# Patient Record
Sex: Female | Born: 1935 | Race: White | Hispanic: No | Marital: Married
Health system: Southern US, Community
[De-identification: ages and names within clinical notes are randomized; demographics above are authoritative.]

---

## 2004-08-17 ENCOUNTER — Ambulatory Visit: Payer: Self-pay | Admitting: Internal Medicine

## 2004-11-04 ENCOUNTER — Ambulatory Visit: Payer: Self-pay | Admitting: Internal Medicine

## 2005-02-03 ENCOUNTER — Ambulatory Visit: Payer: Self-pay | Admitting: Internal Medicine

## 2005-05-12 ENCOUNTER — Ambulatory Visit: Payer: Self-pay | Admitting: Internal Medicine

## 2005-08-04 ENCOUNTER — Ambulatory Visit: Payer: Self-pay | Admitting: Internal Medicine

## 2005-08-17 ENCOUNTER — Ambulatory Visit: Payer: Self-pay | Admitting: Internal Medicine

## 2005-11-06 ENCOUNTER — Ambulatory Visit: Payer: Self-pay | Admitting: Internal Medicine

## 2005-11-17 ENCOUNTER — Ambulatory Visit: Payer: Self-pay | Admitting: Internal Medicine

## 2006-03-06 ENCOUNTER — Ambulatory Visit: Payer: Self-pay | Admitting: Internal Medicine

## 2006-09-03 ENCOUNTER — Ambulatory Visit: Payer: Self-pay | Admitting: Internal Medicine

## 2006-09-17 ENCOUNTER — Ambulatory Visit: Payer: Self-pay | Admitting: Internal Medicine

## 2007-02-16 ENCOUNTER — Ambulatory Visit: Payer: Self-pay | Admitting: Internal Medicine

## 2007-03-02 ENCOUNTER — Ambulatory Visit: Payer: Self-pay | Admitting: Internal Medicine

## 2007-03-18 ENCOUNTER — Ambulatory Visit: Payer: Self-pay | Admitting: Internal Medicine

## 2007-06-14 ENCOUNTER — Ambulatory Visit: Payer: Self-pay | Admitting: Ophthalmology

## 2007-06-14 ENCOUNTER — Other Ambulatory Visit: Payer: Self-pay

## 2007-06-21 ENCOUNTER — Ambulatory Visit: Payer: Self-pay | Admitting: Ophthalmology

## 2007-08-18 ENCOUNTER — Ambulatory Visit: Payer: Self-pay | Admitting: Internal Medicine

## 2007-08-31 ENCOUNTER — Ambulatory Visit: Payer: Self-pay | Admitting: Internal Medicine

## 2007-09-17 ENCOUNTER — Ambulatory Visit: Payer: Self-pay

## 2007-09-18 ENCOUNTER — Ambulatory Visit: Payer: Self-pay | Admitting: Internal Medicine

## 2007-10-04 ENCOUNTER — Ambulatory Visit: Payer: Self-pay | Admitting: Urology

## 2007-12-01 ENCOUNTER — Ambulatory Visit: Payer: Self-pay | Admitting: Urology

## 2008-03-13 ENCOUNTER — Ambulatory Visit: Payer: Self-pay | Admitting: Urology

## 2008-06-16 ENCOUNTER — Ambulatory Visit: Payer: Self-pay | Admitting: Urology

## 2008-06-17 ENCOUNTER — Ambulatory Visit: Payer: Self-pay | Admitting: Internal Medicine

## 2008-06-30 ENCOUNTER — Ambulatory Visit: Payer: Self-pay | Admitting: Internal Medicine

## 2008-07-18 ENCOUNTER — Ambulatory Visit: Payer: Self-pay | Admitting: Internal Medicine

## 2008-08-17 ENCOUNTER — Ambulatory Visit: Payer: Self-pay | Admitting: Internal Medicine

## 2008-09-17 ENCOUNTER — Ambulatory Visit: Payer: Self-pay | Admitting: Internal Medicine

## 2008-09-17 ENCOUNTER — Ambulatory Visit: Payer: Self-pay | Admitting: Radiation Oncology

## 2008-10-17 ENCOUNTER — Ambulatory Visit: Payer: Self-pay | Admitting: Radiation Oncology

## 2008-10-17 ENCOUNTER — Ambulatory Visit: Payer: Self-pay | Admitting: Internal Medicine

## 2008-10-30 ENCOUNTER — Inpatient Hospital Stay: Payer: Self-pay | Admitting: Internal Medicine

## 2008-11-06 ENCOUNTER — Inpatient Hospital Stay: Payer: Self-pay | Admitting: Oncology

## 2008-11-17 ENCOUNTER — Ambulatory Visit: Payer: Self-pay | Admitting: Radiation Oncology

## 2008-11-25 ENCOUNTER — Ambulatory Visit: Payer: Self-pay | Admitting: Internal Medicine

## 2008-12-18 ENCOUNTER — Ambulatory Visit: Payer: Self-pay | Admitting: Radiation Oncology

## 2008-12-18 ENCOUNTER — Ambulatory Visit: Payer: Self-pay | Admitting: Internal Medicine

## 2009-01-15 ENCOUNTER — Ambulatory Visit: Payer: Self-pay | Admitting: Radiation Oncology

## 2009-01-15 ENCOUNTER — Ambulatory Visit: Payer: Self-pay | Admitting: Internal Medicine

## 2009-02-15 ENCOUNTER — Ambulatory Visit: Payer: Self-pay | Admitting: Radiation Oncology

## 2009-02-15 ENCOUNTER — Ambulatory Visit: Payer: Self-pay | Admitting: Internal Medicine

## 2009-03-17 ENCOUNTER — Ambulatory Visit: Payer: Self-pay | Admitting: Oncology

## 2009-03-21 ENCOUNTER — Ambulatory Visit: Payer: Self-pay | Admitting: Oncology

## 2009-04-17 ENCOUNTER — Ambulatory Visit: Payer: Self-pay | Admitting: Oncology

## 2009-05-17 ENCOUNTER — Ambulatory Visit: Payer: Self-pay | Admitting: Oncology

## 2009-06-17 ENCOUNTER — Ambulatory Visit: Payer: Self-pay | Admitting: Oncology

## 2009-06-21 ENCOUNTER — Ambulatory Visit: Payer: Self-pay | Admitting: Oncology

## 2009-07-18 ENCOUNTER — Ambulatory Visit: Payer: Self-pay | Admitting: Oncology

## 2009-08-05 ENCOUNTER — Emergency Department: Payer: Self-pay | Admitting: Emergency Medicine

## 2009-08-05 IMAGING — CT CT ABDOMEN WO/W CM
2 of 4 series · 14 of 32 positions shown, 19 images · non-contrast
Comparison: none

REASON FOR EXAM: Proteinuria, renal cell CA
COMMENTS:

PROCEDURE:     CT  - CT ABDOMEN STANDARD W/WO  - December 01, 2007  [DATE]
RESULT:     Triphasic CT of the abdomen is performed utilizing 85 ml of
Qsovue-JLX iodinated intravenous contrast.
Comparison is made to the most recent study dated 09/17/2007.

[Series 4: soft tissue with · axial · 0.63mm/px · z∈[-830,-654]mm · 8 of 47 slices shown, 13 images]
[im 6/47  soft-tissue]
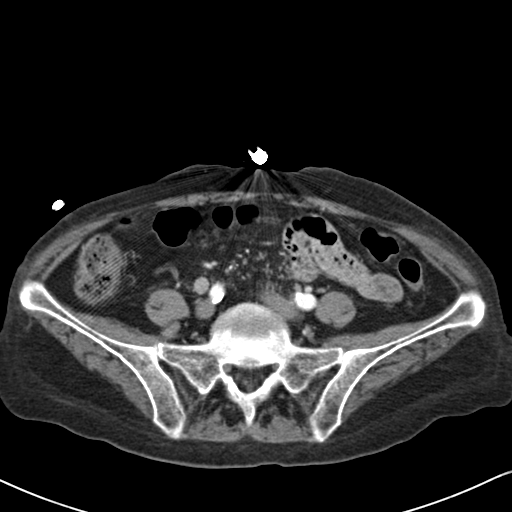
[im 6/47  bone]
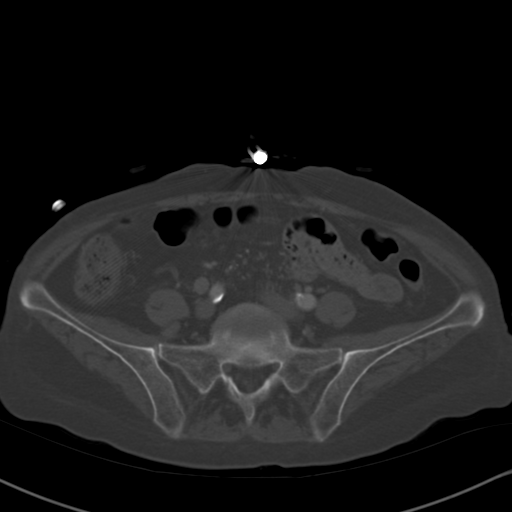
[im 11/47  soft-tissue]
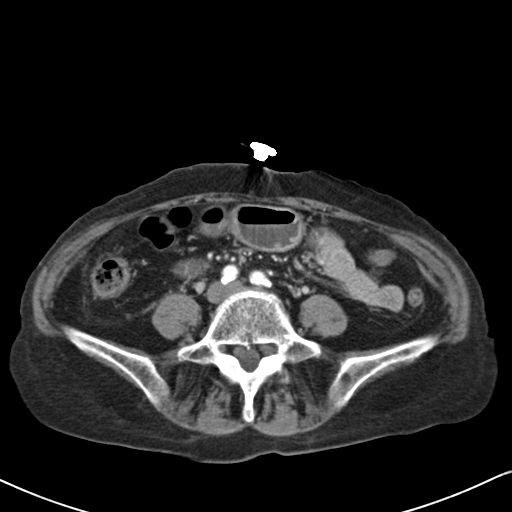
[im 16/47  soft-tissue]
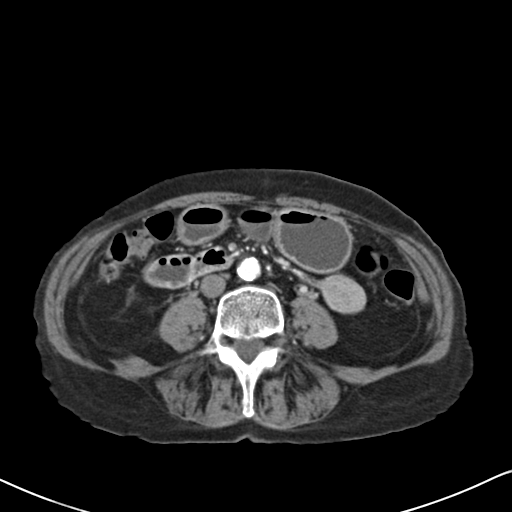
[im 21/47  soft-tissue]
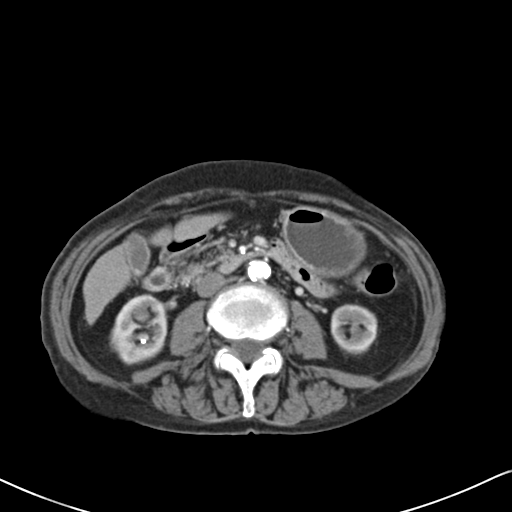
[im 26/47  soft-tissue]
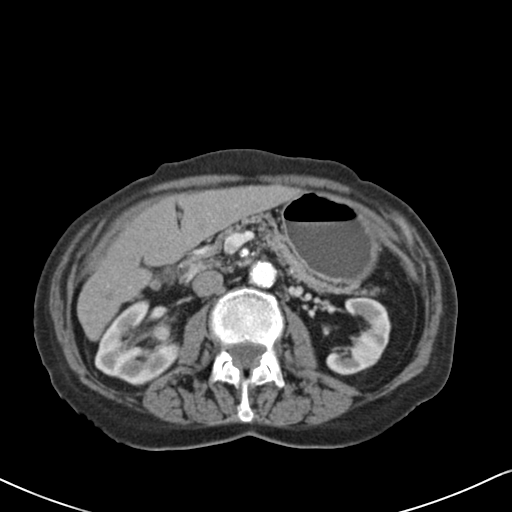
[im 26/47  lung]
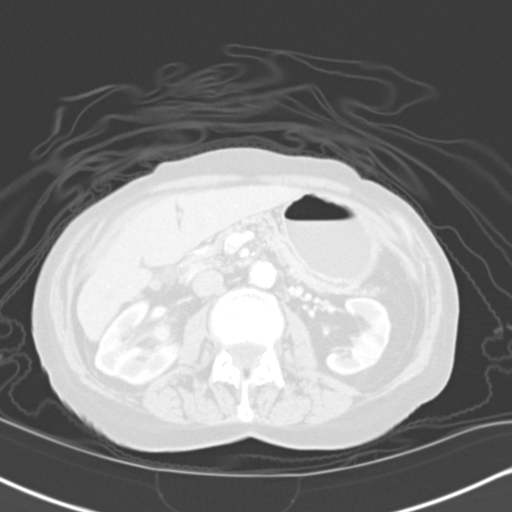
[im 31/47  soft-tissue]
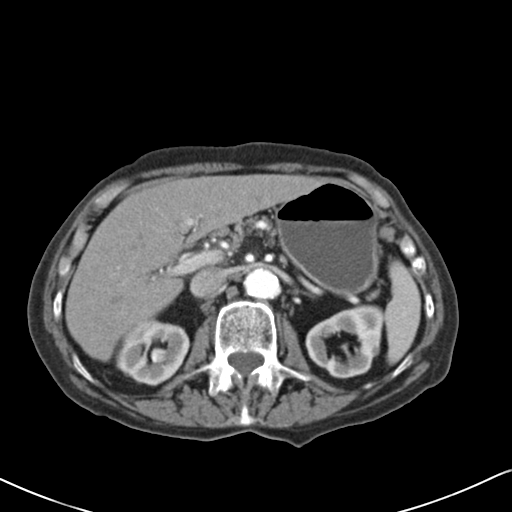
[im 31/47  lung]
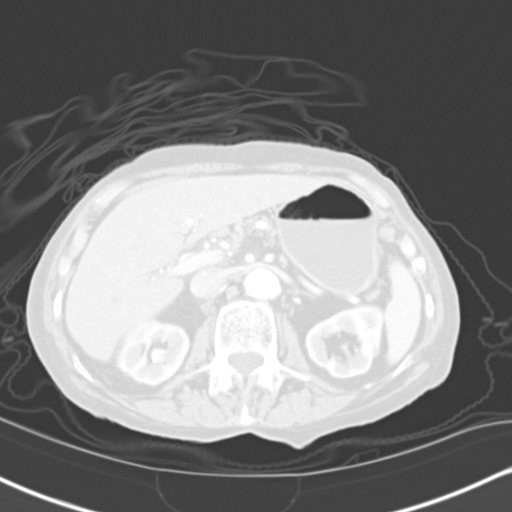
[im 36/47  soft-tissue]
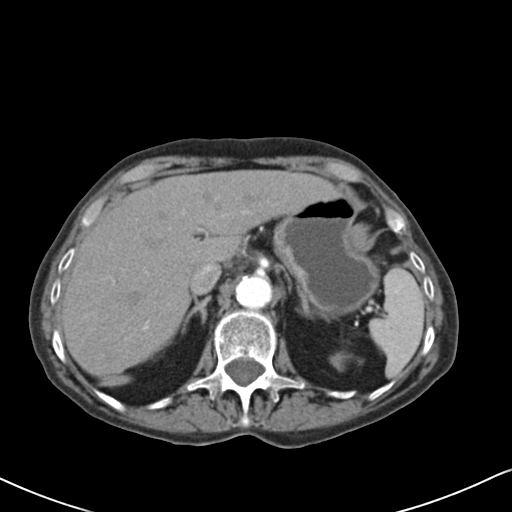
[im 36/47  lung]
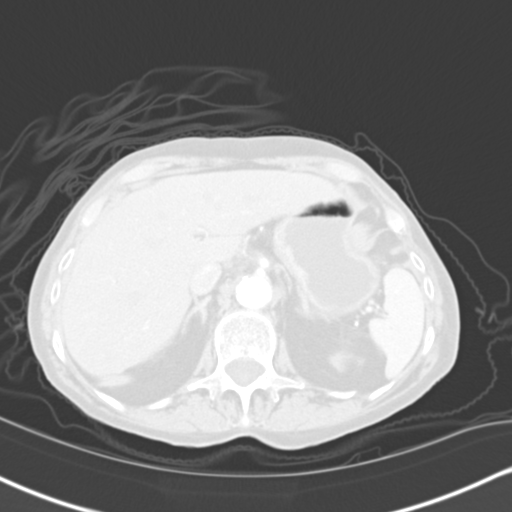
[im 41/47  soft-tissue]
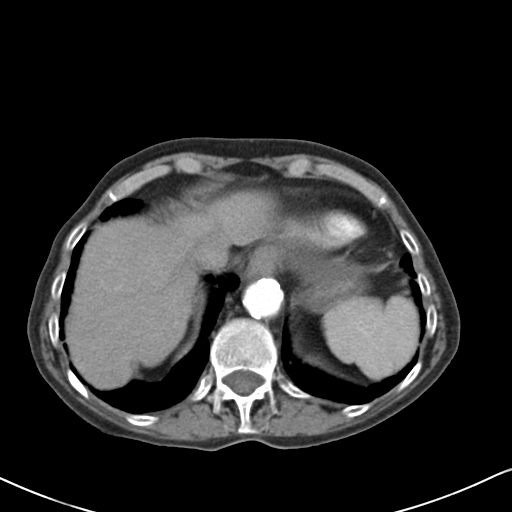
[im 41/47  lung]
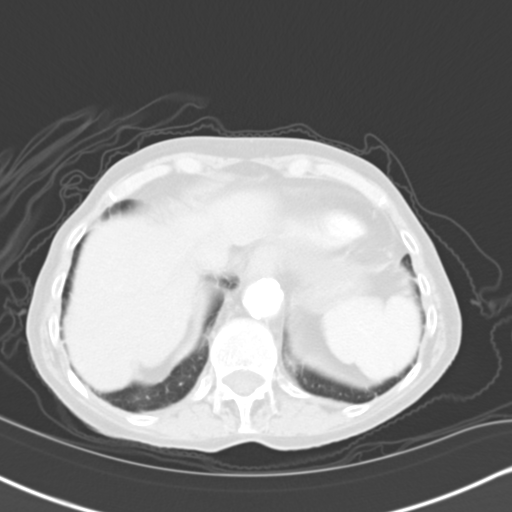

[Series 6: soft tissue delay · axial · delayed · 0.63mm/px · z∈[-830,-704]mm · 6 of 47 slices shown]
[im 6/47  soft-tissue]
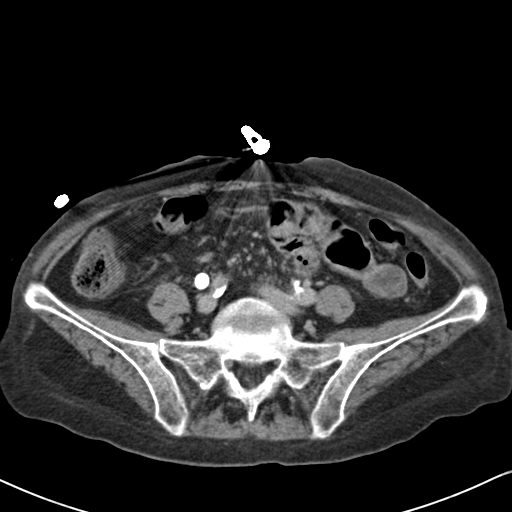
[im 11/47  soft-tissue]
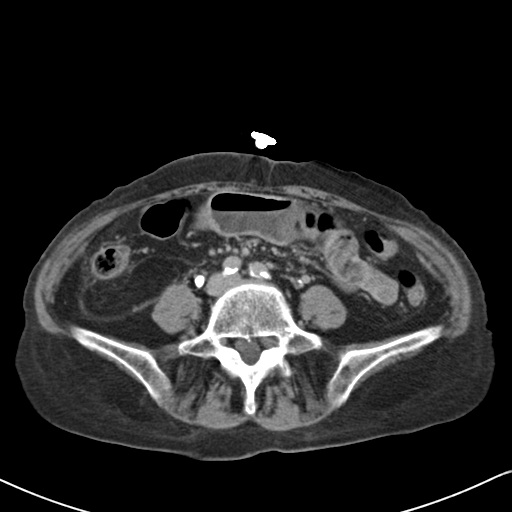
[im 16/47  soft-tissue]
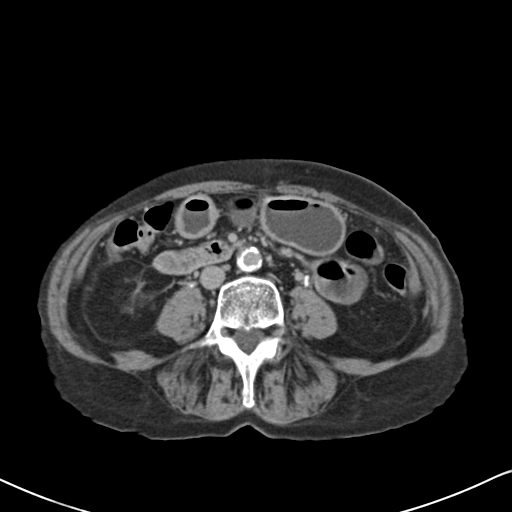
[im 21/47  soft-tissue]
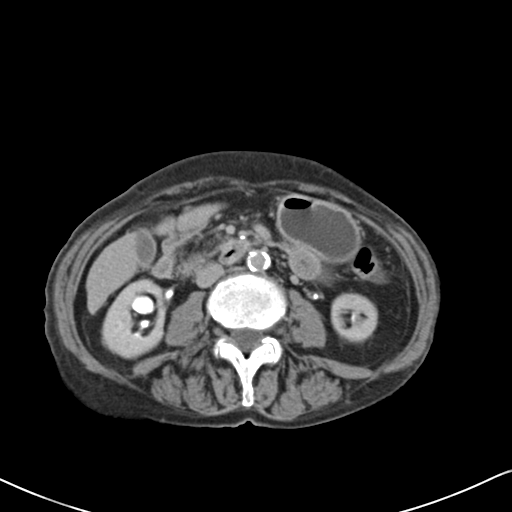
[im 26/47  soft-tissue]
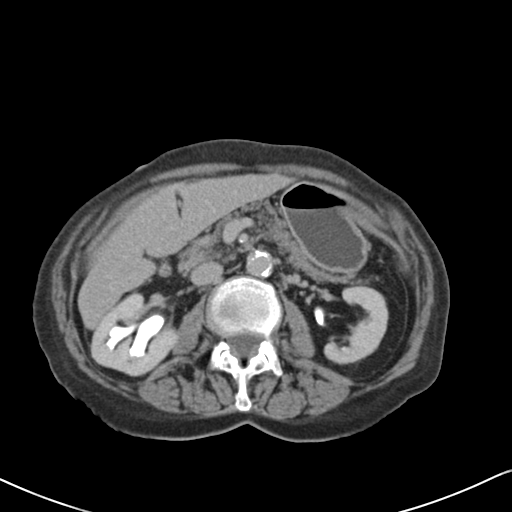
[im 31/47  soft-tissue]
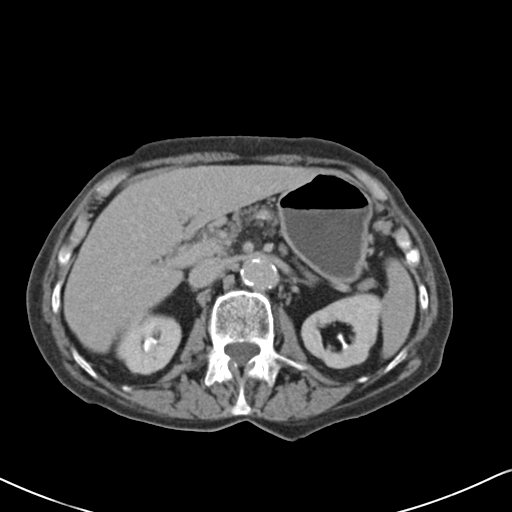

[14 of 32 positions shown; findings below may reference images not displayed]

FINDINGS: Today's exam shows no definite filling defect seen in the RIGHT
renal pelvis region. There is mild hydronephrosis on the RIGHT with
hydroureter evident. The distal portion of the ureter is not seen. Further
investigation may be necessary. There does not appear to be delayed
enhancement or excretion of contrast opacified urine to suggest a high-grade
obstruction. There is evidence of a lower pole RIGHT renal calculus, seen on
the noncontrast images on image #26. This area measures approximately 3.4 mm
in length. Atherosclerotic calcification is present. There is no aneurysm.
There is no abscess or free fluid. The abdominal viscera otherwise appear to
be grossly normal. There are multiple, low attenuation areas in the LEFT
kidney as noted previously. These are suggestive of cysts and appear stable.

Lung base images demonstrate linear fibrosis or atelectasis. There is no
nodule or mass. There is no effusion.
IMPRESSION: 1.  Mild, RIGHT hydronephrosis and proximal hydroureter.
2.  No filling defect evident in the RIGHT renal collecting system.
3.  Stable, low attenuation areas in the LEFT kidney suggestive of cysts.

## 2009-08-17 ENCOUNTER — Ambulatory Visit: Payer: Self-pay | Admitting: Oncology

## 2009-08-22 ENCOUNTER — Ambulatory Visit: Payer: Self-pay | Admitting: Oncology

## 2009-09-17 ENCOUNTER — Ambulatory Visit: Payer: Self-pay | Admitting: Oncology

## 2009-10-03 ENCOUNTER — Ambulatory Visit: Payer: Self-pay | Admitting: Oncology

## 2009-10-05 ENCOUNTER — Ambulatory Visit: Payer: Self-pay | Admitting: Oncology

## 2009-10-17 ENCOUNTER — Ambulatory Visit: Payer: Self-pay | Admitting: Oncology

## 2009-12-18 ENCOUNTER — Ambulatory Visit: Payer: Self-pay | Admitting: Oncology

## 2010-01-07 ENCOUNTER — Ambulatory Visit: Payer: Self-pay | Admitting: Oncology

## 2010-01-15 ENCOUNTER — Ambulatory Visit: Payer: Self-pay | Admitting: Oncology

## 2010-04-17 ENCOUNTER — Ambulatory Visit: Payer: Self-pay | Admitting: Oncology

## 2010-05-08 ENCOUNTER — Ambulatory Visit: Payer: Self-pay | Admitting: Oncology

## 2010-05-17 ENCOUNTER — Ambulatory Visit: Payer: Self-pay | Admitting: Oncology

## 2010-07-18 ENCOUNTER — Ambulatory Visit: Payer: Self-pay | Admitting: Oncology

## 2010-08-13 ENCOUNTER — Ambulatory Visit: Payer: Self-pay | Admitting: Physical Medicine and Rehabilitation

## 2010-08-17 ENCOUNTER — Ambulatory Visit: Payer: Self-pay | Admitting: Physical Medicine and Rehabilitation

## 2010-08-17 ENCOUNTER — Ambulatory Visit: Payer: Self-pay | Admitting: Oncology

## 2011-01-21 ENCOUNTER — Ambulatory Visit: Payer: Self-pay | Admitting: Oncology

## 2011-02-16 ENCOUNTER — Ambulatory Visit: Payer: Self-pay | Admitting: Oncology

## 2011-03-26 ENCOUNTER — Ambulatory Visit: Payer: Self-pay | Admitting: Ophthalmology

## 2011-06-06 ENCOUNTER — Ambulatory Visit: Payer: Self-pay | Admitting: Oncology

## 2011-06-18 ENCOUNTER — Ambulatory Visit: Payer: Self-pay | Admitting: Oncology

## 2011-10-07 ENCOUNTER — Ambulatory Visit: Payer: Self-pay | Admitting: Oncology

## 2011-10-18 ENCOUNTER — Ambulatory Visit: Payer: Self-pay | Admitting: Oncology

## 2011-12-30 ENCOUNTER — Ambulatory Visit: Payer: Self-pay | Admitting: Oncology

## 2011-12-30 LAB — COMPREHENSIVE METABOLIC PANEL
Anion Gap: 9 (ref 7–16)
BUN: 15 mg/dL (ref 7–18)
Creatinine: 1.63 mg/dL — ABNORMAL HIGH (ref 0.60–1.30)
EGFR (African American): 38 — ABNORMAL LOW
EGFR (Non-African Amer.): 31 — ABNORMAL LOW
Glucose: 85 mg/dL (ref 65–99)
Potassium: 4.4 mmol/L (ref 3.5–5.1)
SGPT (ALT): 21 U/L
Sodium: 134 mmol/L — ABNORMAL LOW (ref 136–145)
Total Protein: 7.8 g/dL (ref 6.4–8.2)

## 2011-12-30 LAB — CBC CANCER CENTER
Basophil %: 1 %
Eosinophil #: 0.2 x10 3/mm (ref 0.0–0.7)
Eosinophil %: 2.4 %
HGB: 11.6 g/dL — ABNORMAL LOW (ref 12.0–16.0)
Lymphocyte #: 1.1 x10 3/mm (ref 1.0–3.6)
Lymphocyte %: 15.8 %
MCH: 32.1 pg (ref 26.0–34.0)
MCHC: 33.7 g/dL (ref 32.0–36.0)
MCV: 95 fL (ref 80–100)
Monocyte %: 10.1 %
Neutrophil %: 70.7 %
RBC: 3.6 10*6/uL — ABNORMAL LOW (ref 3.80–5.20)
RDW: 14.1 % (ref 11.5–14.5)
WBC: 7 x10 3/mm (ref 3.6–11.0)

## 2012-01-16 ENCOUNTER — Ambulatory Visit: Payer: Self-pay | Admitting: Oncology

## 2012-03-30 ENCOUNTER — Ambulatory Visit: Payer: Self-pay | Admitting: Oncology

## 2012-03-30 LAB — CBC CANCER CENTER
Eosinophil #: 0 x10 3/mm (ref 0.0–0.7)
HCT: 34.5 % — ABNORMAL LOW (ref 35.0–47.0)
HGB: 11.1 g/dL — ABNORMAL LOW (ref 12.0–16.0)
Lymphocyte #: 1.2 x10 3/mm (ref 1.0–3.6)
Monocyte #: 1.1 x10 3/mm — ABNORMAL HIGH (ref 0.2–0.9)
Monocyte %: 11.9 %
Neutrophil #: 6.7 x10 3/mm — ABNORMAL HIGH (ref 1.4–6.5)
Neutrophil %: 74.4 %
Platelet: 133 x10 3/mm — ABNORMAL LOW (ref 150–440)
RDW: 15.8 % — ABNORMAL HIGH (ref 11.5–14.5)
WBC: 9 x10 3/mm (ref 3.6–11.0)

## 2012-03-30 LAB — COMPREHENSIVE METABOLIC PANEL
Albumin: 3.1 g/dL — ABNORMAL LOW (ref 3.4–5.0)
Alkaline Phosphatase: 56 U/L (ref 50–136)
Anion Gap: 11 (ref 7–16)
Chloride: 111 mmol/L — ABNORMAL HIGH (ref 98–107)
Co2: 20 mmol/L — ABNORMAL LOW (ref 21–32)
Creatinine: 2.29 mg/dL — ABNORMAL HIGH (ref 0.60–1.30)
EGFR (African American): 21 — ABNORMAL LOW
EGFR (Non-African Amer.): 18 — ABNORMAL LOW
Glucose: 122 mg/dL — ABNORMAL HIGH (ref 65–99)
Osmolality: 292 (ref 275–301)
SGOT(AST): 29 U/L (ref 15–37)
SGPT (ALT): 14 U/L
Sodium: 142 mmol/L (ref 136–145)

## 2012-04-17 ENCOUNTER — Ambulatory Visit: Payer: Self-pay | Admitting: Oncology

## 2012-06-17 ENCOUNTER — Ambulatory Visit: Payer: Self-pay | Admitting: Oncology

## 2012-07-13 LAB — URINALYSIS, COMPLETE
Bacteria: NONE SEEN
Bilirubin,UR: NEGATIVE
Blood: NEGATIVE
Glucose,UR: NEGATIVE mg/dL (ref 0–75)
Hyaline Cast: 1
Ketone: NEGATIVE
Leukocyte Esterase: NEGATIVE
Nitrite: NEGATIVE
Ph: 5 (ref 4.5–8.0)
Protein: NEGATIVE
RBC,UR: 1 /HPF (ref 0–5)
Specific Gravity: 1.006 (ref 1.003–1.030)
Squamous Epithelial: NONE SEEN
WBC UR: <1 /HPF (ref 0–5)

## 2012-07-13 LAB — COMPREHENSIVE METABOLIC PANEL
Albumin: 3.1 g/dL — ABNORMAL LOW (ref 3.4–5.0)
Alkaline Phosphatase: 57 U/L (ref 50–136)
Anion Gap: 9 (ref 7–16)
BUN: 36 mg/dL — ABNORMAL HIGH (ref 7–18)
Bilirubin,Total: 1.7 mg/dL — ABNORMAL HIGH (ref 0.2–1.0)
Calcium, Total: 8.8 mg/dL (ref 8.5–10.1)
Chloride: 104 mmol/L (ref 98–107)
Co2: 22 mmol/L (ref 21–32)
Creatinine: 1.96 mg/dL — ABNORMAL HIGH (ref 0.60–1.30)
EGFR (African American): 25 — ABNORMAL LOW
EGFR (Non-African Amer.): 22 — ABNORMAL LOW
Glucose: 99 mg/dL (ref 65–99)
Osmolality: 278 (ref 275–301)
Potassium: 4.5 mmol/L (ref 3.5–5.1)
SGOT(AST): 47 U/L — ABNORMAL HIGH (ref 15–37)
SGPT (ALT): 17 U/L (ref 12–78)
Sodium: 135 mmol/L — ABNORMAL LOW (ref 136–145)
Total Protein: 7 g/dL (ref 6.4–8.2)

## 2012-07-13 LAB — CBC WITH DIFFERENTIAL/PLATELET
Basophil #: 0.1 10*3/uL (ref 0.0–0.1)
Basophil %: 0.7 %
Eosinophil #: 0.1 10*3/uL (ref 0.0–0.7)
Eosinophil %: 0.6 %
HCT: 37.6 % (ref 35.0–47.0)
HGB: 12.3 g/dL (ref 12.0–16.0)
Lymphocyte #: 0.8 10*3/uL — ABNORMAL LOW (ref 1.0–3.6)
Lymphocyte %: 7.7 %
MCH: 32.4 pg (ref 26.0–34.0)
MCHC: 32.7 g/dL (ref 32.0–36.0)
MCV: 99 fL (ref 80–100)
Monocyte #: 1.4 x10 3/mm — ABNORMAL HIGH (ref 0.2–0.9)
Monocyte %: 12.5 %
Neutrophil #: 8.7 10*3/uL — ABNORMAL HIGH (ref 1.4–6.5)
Neutrophil %: 78.5 %
Platelet: 151 10*3/uL (ref 150–440)
RBC: 3.79 10*6/uL — ABNORMAL LOW (ref 3.80–5.20)
RDW: 19.4 % — ABNORMAL HIGH (ref 11.5–14.5)
WBC: 11 10*3/uL (ref 3.6–11.0)

## 2012-07-13 LAB — CK TOTAL AND CKMB (NOT AT ARMC)
CK, Total: 119 U/L (ref 21–215)
CK-MB: 1.9 ng/mL (ref 0.5–3.6)

## 2012-07-13 LAB — TROPONIN I
Troponin-I: 0.02 ng/mL
Troponin-I: 0.03 ng/mL

## 2012-07-13 LAB — APTT: Activated PTT: 26 secs (ref 23.6–35.9)

## 2012-07-13 LAB — PROTIME-INR
INR: 1.2
Prothrombin Time: 15.4 secs — ABNORMAL HIGH (ref 11.5–14.7)

## 2012-07-13 LAB — TSH: Thyroid Stimulating Horm: 2.35 u[IU]/mL

## 2012-07-14 ENCOUNTER — Inpatient Hospital Stay: Payer: Self-pay | Admitting: Internal Medicine

## 2012-07-14 LAB — CBC WITH DIFFERENTIAL/PLATELET
Basophil #: 0.1 10*3/uL (ref 0.0–0.1)
Eosinophil #: 0.1 10*3/uL (ref 0.0–0.7)
Eosinophil %: 1 %
HGB: 11.9 g/dL — ABNORMAL LOW (ref 12.0–16.0)
Lymphocyte #: 1.3 10*3/uL (ref 1.0–3.6)
Lymphocyte %: 12.4 %
MCH: 33 pg (ref 26.0–34.0)
MCHC: 33.1 g/dL (ref 32.0–36.0)
MCV: 100 fL (ref 80–100)
Monocyte #: 1.3 x10 3/mm — ABNORMAL HIGH (ref 0.2–0.9)
Neutrophil %: 73.5 %
Platelet: 139 10*3/uL — ABNORMAL LOW (ref 150–440)
RDW: 18.6 % — ABNORMAL HIGH (ref 11.5–14.5)

## 2012-07-14 LAB — BASIC METABOLIC PANEL
Anion Gap: 11 (ref 7–16)
BUN: 35 mg/dL — ABNORMAL HIGH (ref 7–18)
Chloride: 106 mmol/L (ref 98–107)
Creatinine: 1.95 mg/dL — ABNORMAL HIGH (ref 0.60–1.30)
EGFR (Non-African Amer.): 22 — ABNORMAL LOW
Glucose: 84 mg/dL (ref 65–99)
Osmolality: 292 (ref 275–301)
Potassium: 3.2 mmol/L — ABNORMAL LOW (ref 3.5–5.1)
Sodium: 143 mmol/L (ref 136–145)

## 2012-07-14 LAB — MAGNESIUM: Magnesium: 1.5 mg/dL — ABNORMAL LOW

## 2012-07-15 LAB — BASIC METABOLIC PANEL
BUN: 32 mg/dL — ABNORMAL HIGH (ref 7–18)
Calcium, Total: 8.4 mg/dL — ABNORMAL LOW (ref 8.5–10.1)
Chloride: 106 mmol/L (ref 98–107)
EGFR (Non-African Amer.): 21 — ABNORMAL LOW
Glucose: 92 mg/dL (ref 65–99)
Osmolality: 290 (ref 275–301)
Potassium: 3 mmol/L — ABNORMAL LOW (ref 3.5–5.1)

## 2012-07-16 ENCOUNTER — Encounter: Payer: Self-pay | Admitting: Internal Medicine

## 2012-07-16 LAB — BASIC METABOLIC PANEL
Anion Gap: 9 (ref 7–16)
BUN: 27 mg/dL — ABNORMAL HIGH (ref 7–18)
Chloride: 110 mmol/L — ABNORMAL HIGH (ref 98–107)
Co2: 23 mmol/L (ref 21–32)
EGFR (Non-African Amer.): 23 — ABNORMAL LOW
Osmolality: 288 (ref 275–301)
Sodium: 142 mmol/L (ref 136–145)

## 2012-07-18 ENCOUNTER — Emergency Department: Payer: Self-pay | Admitting: Unknown Physician Specialty

## 2012-07-18 ENCOUNTER — Ambulatory Visit: Payer: Self-pay | Admitting: Oncology

## 2012-07-19 ENCOUNTER — Encounter: Payer: Self-pay | Admitting: Internal Medicine

## 2012-08-04 ENCOUNTER — Ambulatory Visit: Payer: Self-pay | Admitting: Oncology

## 2012-08-04 LAB — COMPREHENSIVE METABOLIC PANEL
Anion Gap: 4 — ABNORMAL LOW (ref 7–16)
BUN: 31 mg/dL — ABNORMAL HIGH (ref 7–18)
Bilirubin,Total: 0.8 mg/dL (ref 0.2–1.0)
Calcium, Total: 9 mg/dL (ref 8.5–10.1)
Co2: 34 mmol/L — ABNORMAL HIGH (ref 21–32)
Creatinine: 2.24 mg/dL — ABNORMAL HIGH (ref 0.60–1.30)
EGFR (African American): 22 — ABNORMAL LOW
EGFR (Non-African Amer.): 19 — ABNORMAL LOW
Glucose: 172 mg/dL — ABNORMAL HIGH (ref 65–99)
Osmolality: 290 (ref 275–301)
Potassium: 4.6 mmol/L (ref 3.5–5.1)
SGOT(AST): 36 U/L (ref 15–37)
SGPT (ALT): 28 U/L (ref 12–78)
Sodium: 140 mmol/L (ref 136–145)

## 2012-08-04 LAB — CBC CANCER CENTER
Basophil #: 0.1 x10 3/mm (ref 0.0–0.1)
Basophil %: 0.7 %
Eosinophil #: 0 x10 3/mm (ref 0.0–0.7)
Eosinophil %: 0.1 %
Lymphocyte #: 0.8 x10 3/mm — ABNORMAL LOW (ref 1.0–3.6)
Lymphocyte %: 7.1 %
MCH: 31.4 pg (ref 26.0–34.0)
MCHC: 31.4 g/dL — ABNORMAL LOW (ref 32.0–36.0)
Monocyte %: 6.7 %
Neutrophil #: 9.6 x10 3/mm — ABNORMAL HIGH (ref 1.4–6.5)
Neutrophil %: 85.4 %
WBC: 11.2 x10 3/mm — ABNORMAL HIGH (ref 3.6–11.0)

## 2012-08-17 ENCOUNTER — Ambulatory Visit: Payer: Self-pay | Admitting: Oncology

## 2012-09-01 LAB — TROPONIN I: Troponin-I: 0.06 ng/mL — ABNORMAL HIGH

## 2012-09-01 LAB — CBC
HCT: 35.2 % (ref 35.0–47.0)
MCV: 96 fL (ref 80–100)
Platelet: 136 10*3/uL — ABNORMAL LOW (ref 150–440)
RBC: 3.68 10*6/uL — ABNORMAL LOW (ref 3.80–5.20)
RDW: 16 % — ABNORMAL HIGH (ref 11.5–14.5)
WBC: 11 10*3/uL (ref 3.6–11.0)

## 2012-09-01 LAB — COMPREHENSIVE METABOLIC PANEL
Albumin: 3.2 g/dL — ABNORMAL LOW (ref 3.4–5.0)
Alkaline Phosphatase: 68 U/L (ref 50–136)
Anion Gap: 13 (ref 7–16)
Calcium, Total: 8.9 mg/dL (ref 8.5–10.1)
Co2: 18 mmol/L — ABNORMAL LOW (ref 21–32)
Creatinine: 1.99 mg/dL — ABNORMAL HIGH (ref 0.60–1.30)
EGFR (Non-African Amer.): 22 — ABNORMAL LOW
Glucose: 101 mg/dL — ABNORMAL HIGH (ref 65–99)
Osmolality: 295 (ref 275–301)
SGPT (ALT): 22 U/L (ref 12–78)
Sodium: 144 mmol/L (ref 136–145)

## 2012-09-01 LAB — URINALYSIS, COMPLETE
Bacteria: NONE SEEN
Bilirubin,UR: NEGATIVE
Blood: NEGATIVE
Glucose,UR: NEGATIVE mg/dL (ref 0–75)
Leukocyte Esterase: NEGATIVE
Nitrite: NEGATIVE
Protein: 30
RBC,UR: 2 /HPF (ref 0–5)

## 2012-09-01 LAB — CK TOTAL AND CKMB (NOT AT ARMC)
CK, Total: 43 U/L (ref 21–215)
CK-MB: 1.1 ng/mL (ref 0.5–3.6)

## 2012-09-02 ENCOUNTER — Inpatient Hospital Stay: Payer: Self-pay | Admitting: Internal Medicine

## 2012-09-02 LAB — COMPREHENSIVE METABOLIC PANEL
Anion Gap: 14 (ref 7–16)
Bilirubin,Total: 1 mg/dL (ref 0.2–1.0)
Chloride: 117 mmol/L — ABNORMAL HIGH (ref 98–107)
Co2: 14 mmol/L — ABNORMAL LOW (ref 21–32)
Creatinine: 1.69 mg/dL — ABNORMAL HIGH (ref 0.60–1.30)
EGFR (African American): 30 — ABNORMAL LOW
EGFR (Non-African Amer.): 26 — ABNORMAL LOW
Glucose: 86 mg/dL (ref 65–99)
Osmolality: 296 (ref 275–301)
Potassium: 5 mmol/L (ref 3.5–5.1)
Sodium: 145 mmol/L (ref 136–145)

## 2012-09-02 LAB — CBC WITH DIFFERENTIAL/PLATELET
Basophil #: 0.1 10*3/uL (ref 0.0–0.1)
Eosinophil %: 0.2 %
HCT: 34.4 % — ABNORMAL LOW (ref 35.0–47.0)
Lymphocyte #: 1.2 10*3/uL (ref 1.0–3.6)
Lymphocyte %: 11.6 %
MCV: 94 fL (ref 80–100)
Monocyte %: 10.9 %
Neutrophil #: 8.2 10*3/uL — ABNORMAL HIGH (ref 1.4–6.5)
Neutrophil %: 76.7 %
Platelet: 135 10*3/uL — ABNORMAL LOW (ref 150–440)
RBC: 3.67 10*6/uL — ABNORMAL LOW (ref 3.80–5.20)
RDW: 16.1 % — ABNORMAL HIGH (ref 11.5–14.5)
WBC: 10.7 10*3/uL (ref 3.6–11.0)

## 2012-09-02 LAB — CK TOTAL AND CKMB (NOT AT ARMC)
CK, Total: 55 U/L (ref 21–215)
CK, Total: 49 U/L (ref 21–215)
CK, Total: 57 U/L (ref 21–215)
CK-MB: 1.5 ng/mL (ref 0.5–3.6)
CK-MB: 1.5 ng/mL (ref 0.5–3.6)

## 2012-09-02 LAB — TROPONIN I
Troponin-I: 0.05 ng/mL
Troponin-I: 0.06 ng/mL — ABNORMAL HIGH

## 2012-09-04 LAB — BASIC METABOLIC PANEL
Anion Gap: 10 (ref 7–16)
BUN: 26 mg/dL — ABNORMAL HIGH (ref 7–18)
Chloride: 113 mmol/L — ABNORMAL HIGH (ref 98–107)
EGFR (African American): 30 — ABNORMAL LOW
EGFR (Non-African Amer.): 26 — ABNORMAL LOW
Glucose: 104 mg/dL — ABNORMAL HIGH (ref 65–99)
Potassium: 4.2 mmol/L (ref 3.5–5.1)
Sodium: 142 mmol/L (ref 136–145)

## 2012-10-17 ENCOUNTER — Ambulatory Visit: Payer: Self-pay | Admitting: Oncology

## 2012-11-17 ENCOUNTER — Ambulatory Visit: Payer: Self-pay | Admitting: Oncology

## 2013-01-15 DEATH — deceased

## 2014-03-18 IMAGING — CR DG CHEST 1V PORT
1 series · 1 of 1 positions shown · non-contrast
Comparison: none

REASON FOR EXAM: weakness
COMMENTS:

PROCEDURE:     DXR - DXR PORTABLE CHEST SINGLE VIEW  - July 13, 2012 [DATE]
RESULT:     Comparison: 11/20/2008

[portable]
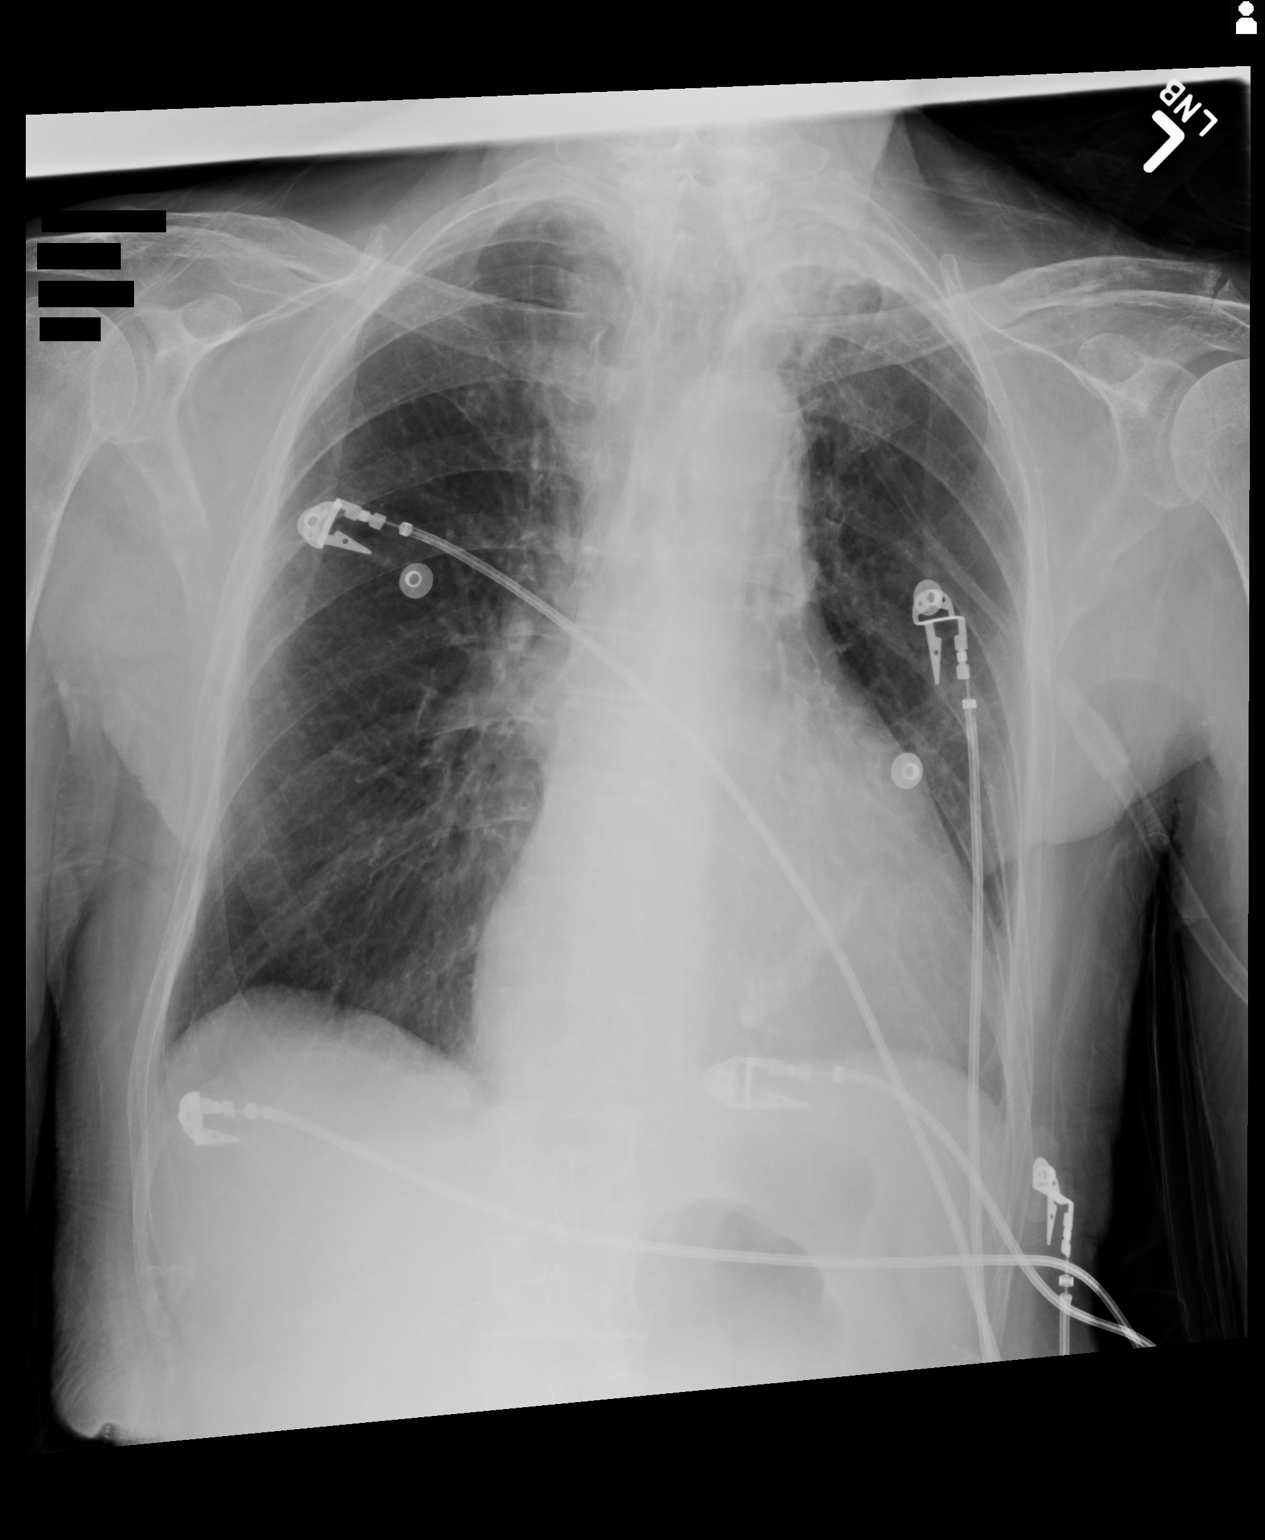

[1 of 1 positions shown; findings below may reference images not displayed]

FINDINGS: The heart and mediastinum are stable. The lungs are hyperinflated. Coarse
reticular opacities in the lung apices likely secondary to fibrotic changes,
left greater than right. No new focal pulmonary opacities.
IMPRESSION: No acute cardiopulmonary disease. Hyperinflation with fibrotic changes in
the lung apices, similar to prior.

[REDACTED]

## 2014-03-19 IMAGING — CR DG CHEST 2V
1 series · 3 of 3 positions shown · non-contrast
Comparison: none

REASON FOR EXAM: a.fib
COMMENTS:

[Series 1: ap · 0.17mm/px · 3 of 3 slices shown]
[im 1/3]
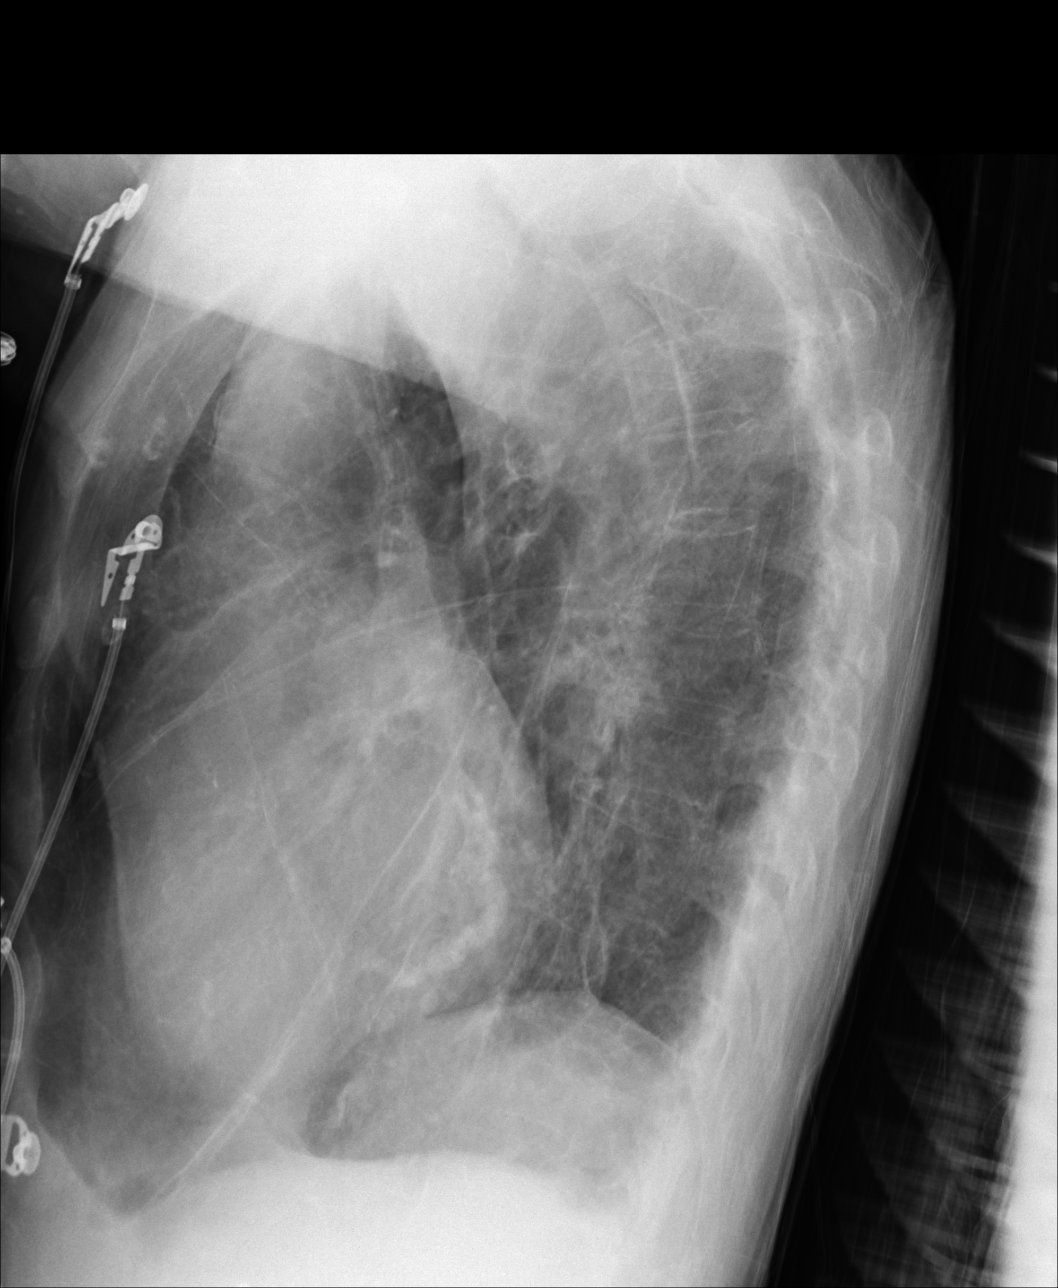
[im 2/3]
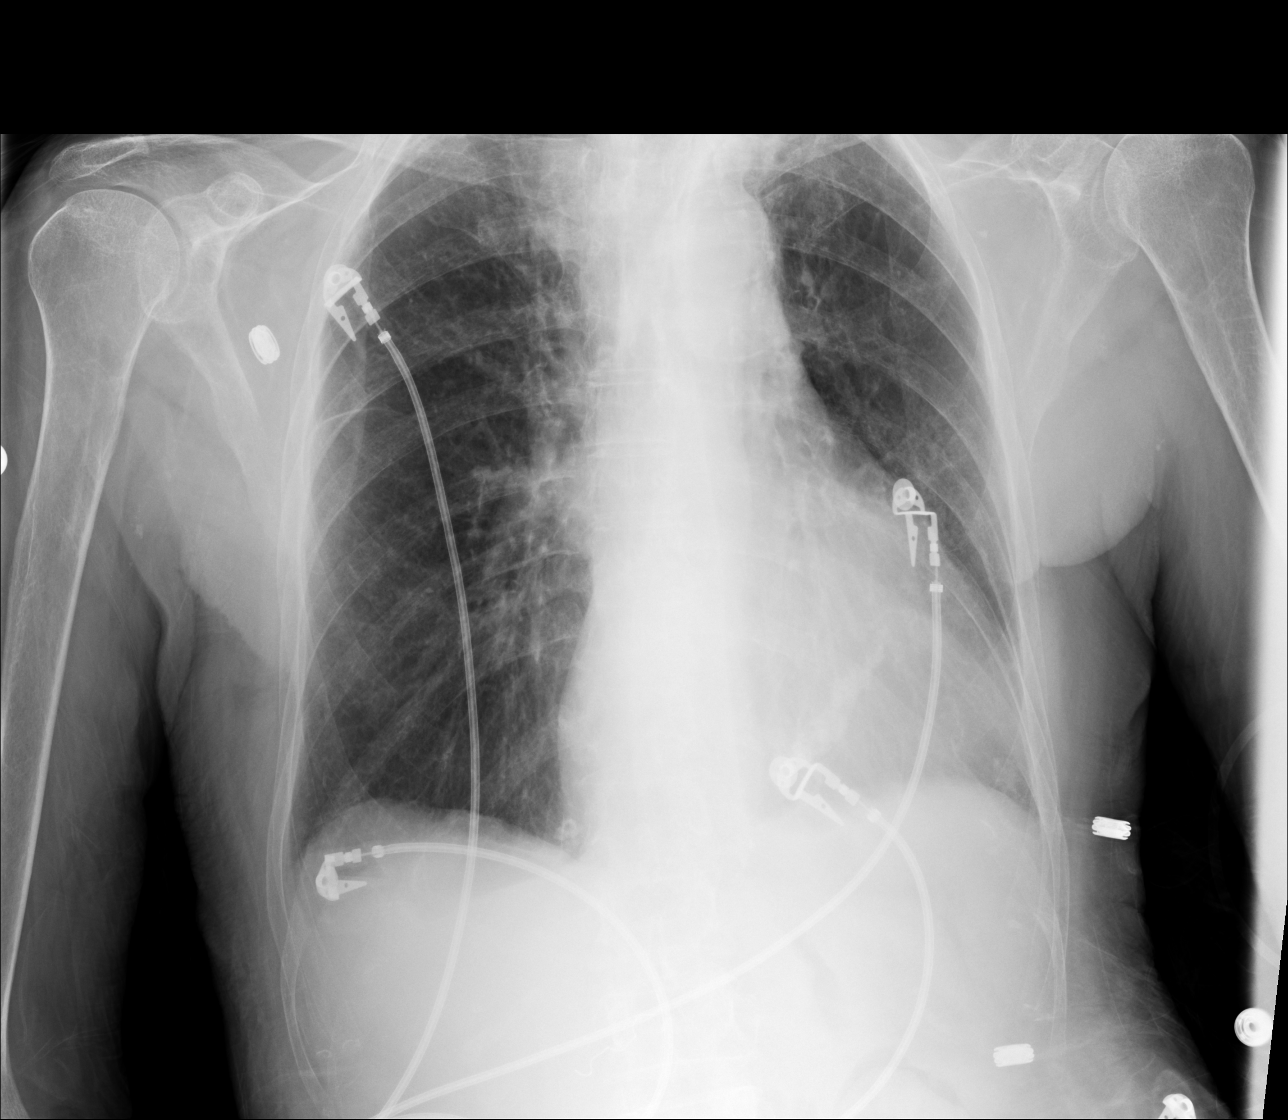
[im 3/3]
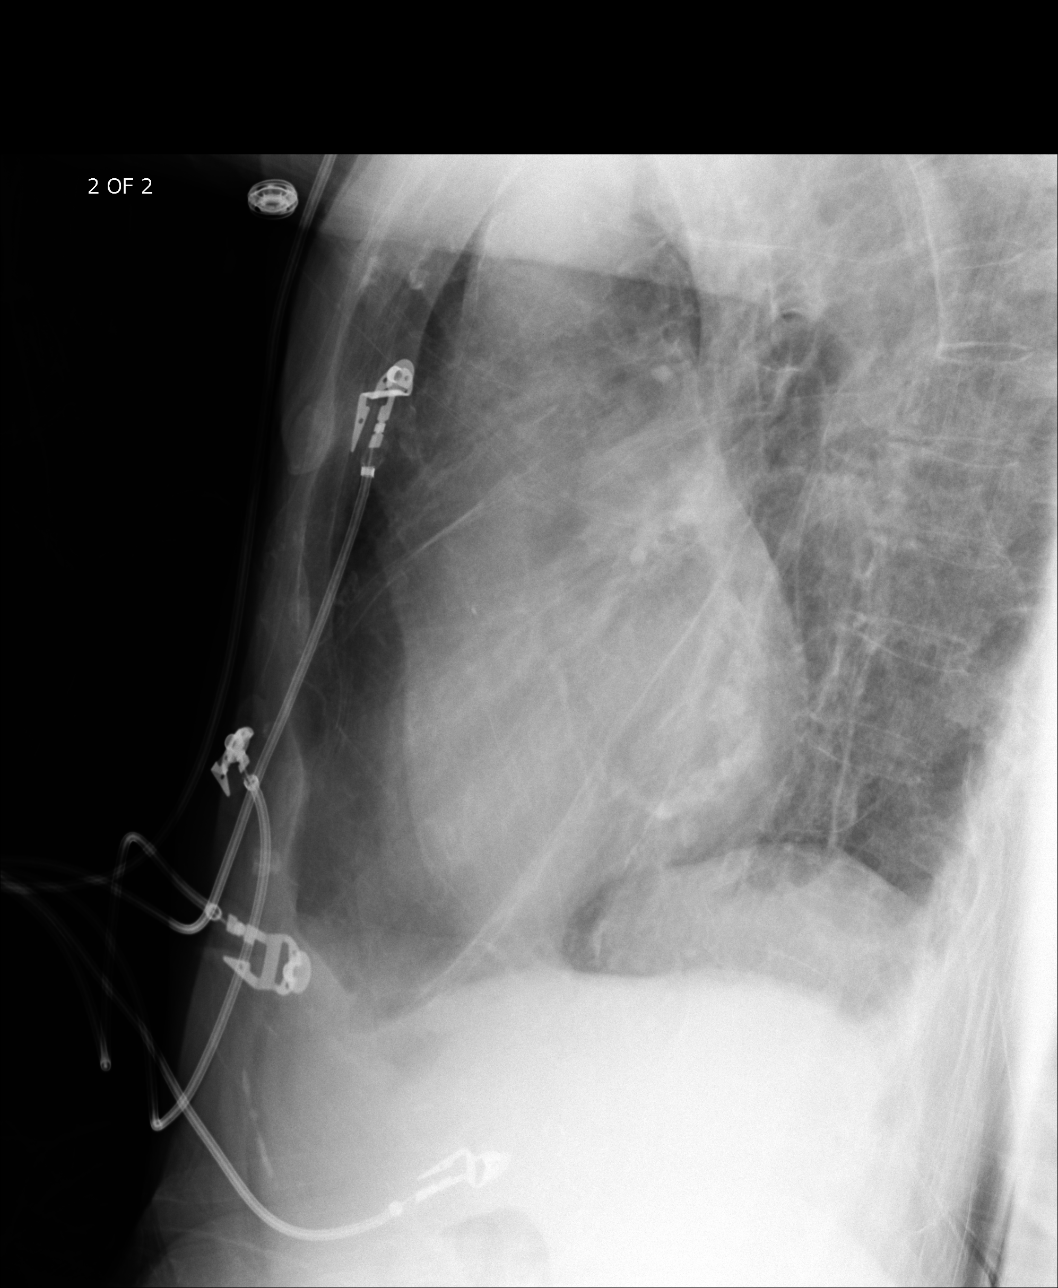

[3 of 3 positions shown; findings below may reference images not displayed]

PROCEDURE:     DXR - DXR CHEST PA (OR AP) AND LATERAL  - July 14, 2012  [DATE]

RESULT:     Comparison is made to the previous examination dated 13 July, 2012.

The lungs are hyperinflated consistent with significant COPD. The cardiac
silhouette is enlarged. Apical densities suggest fibrosis. There is no
effusion or pneumothorax. A developing mass is not appreciated.
IMPRESSION: 1. COPD with cardiomegaly. Underlying pulmonary fibrotic changes appear
present. No acute abnormality otherwise.

[REDACTED]

## 2015-03-06 NOTE — Discharge Summary (Signed)
PATIENT NAME:  Alexis Costa, Alexis Costa MR#:  [id number] DATE OF BIRTH:  Jun 02, 1919  DATE OF ADMISSION:  09/02/2012 DATE OF DISCHARGE:  09/08/2012  ADDENDUM: The patient was supposed to go to [facility] on 09/07/2012 but did not go because the PASSAR number was not available. The patient's PASSAR number is available today and she is going to [facility] today. The patient's discharge summary and discharge instructions are remaining the same. I discussed the options with the family. The patient is stable for discharge.   TIME SPENT: Less than 30 minutes.  ____________________________ Katha HammingSnehalatha Shinika Estelle, MD sk:slb D: 09/08/2012 13:09:45 ET T: 09/08/2012 13:19:53 ET JOB#: [id number]  cc: Katha HammingSnehalatha Min Tunnell, MD, <Dictator> Katha HammingSNEHALATHA Haille Pardi MD ELECTRONICALLY SIGNED 09/28/2012 15:46

## 2015-03-06 NOTE — Discharge Summary (Signed)
PATIENT NAME:  Alexis PiqueHINTON, Dorri C MR#:  [id number] DATE OF BIRTH:  [birth year]  DATE OF ADMISSION:  09/02/2012 DATE OF DISCHARGE:  09/07/2012  DISPOSITION: [facility].   DISCHARGE DIAGNOSES:  1. Generalized weakness with left leg pain due to arthritis and also ambulatory dysfunction.  2. Chronic atrial fibrillation.  3. Hypertension.  4. Elevated troponins due to demand ischemia.  5. Glaucoma.  6. Gastroesophageal reflux disease.  7. Anxiety and depression.    CONSULTATIONS:  1. Cardiology consultation, 2. Orthopedic consult.   HOSPITAL COURSE: 81 year old female with weakness, failure to thrive, left leg pain and arthritis, history of atrial fibrillation, chronic kidney disease stage III, systolic heart failure admitted for weakness, collapse secondary to unable to walk. Patient is admitted for that. Look at the history and physical for full details. Patient was in [facility] and was discharged from [facility]. Patient was in [facility] for three weeks before she went home and then when she went home she was not able to move, had severe leg pain so she was admitted for that. Patient has history of breast cancer, osteoporosis, history of systolic heart failure, hypertension, depression, anxiety, chronic atrial fibrillation. Her blood pressure was 145/85, heart rate was 130s to 140s. She was admitted for ambulatory dysfunction with generalized weakness with RVR. Patient started on  cardizem  on top of metoprolol. Echocardiogram and cardiology consult obtained. Patient's echocardiogram showed ejection fraction of 25% with global hypokinesia. Troponin peaked up to only 0.06 and then dropped to 0.05. 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 Patient did not have any chest pain. Patient right now is stabilized on metoprolol and Cardizem. Patient is not a candidate for anticoagulation due to history of falls. Patient's heart rate has been in 90s  and blood pressure also is controlled at 138/99. She is on metoprolol 100 p.o. b.i.d. and Cardizem 120 daily, that can be continued.  Left leg pain, deconditioning, history of falls. Seen by orthopedic doctor, Dr. Myra Rudehristopher Smith. X-ray of the hip were reviewed by him. Patient's left leg pain related to  degenaration  . X-ray of the hip on the left side showed no evidence of loosening. Dr. Katrinka BlazingSmith recommended pain medication and physical therapy and discussed with the family as fixing that will take major operation effort and she won't be able to withstand. Patient seen by physical therapist and she is also on pain medications with oxycodone and they recommended acute rehab. Patient was able to sit in the chair yesterday with physical therapy.  also she  is walking with  rolling walker. Patient recommended to go to short-term rehab and family decided [facility].   Chronic kidney disease. Creatinine is stable at 1.7 on 10/19.   Patient has depression. She is on Celexa.   chronic chf with EF  less than 40 and she also has atrial fibrillation. She is on metoprolol and Cardizem. Unable to use ACE inhibitor due to chronic kidney disease.   CODE STATUS: FULL CODE.   Overall prognosis is poor due to multiple medical problems, advanced age and high risk for falls.    TIME SPENT: More than 30 minutes.   ____________________________ Katha HammingSnehalatha Sanjuanita Condrey, MD sk:cms D: 09/07/2012 13:59:37 ET T: 09/07/2012 14:50:38 ET JOB#: [id number]  cc: Katha HammingSnehalatha Kaylin Schellenberg, MD, <Dictator>  Katha HammingSNEHALATHA Raygan Skarda MD ELECTRONICALLY SIGNED 09/28/2012 15:50

## 2015-03-06 NOTE — Consult Note (Signed)
81 year old female referred for evaluation of left leg pain. and xrays fully reviewed. of hip two views shows previous cemented hemiarthroplasty presumably due to fracture. There is no evidence of loosening nor acute injury. She does have thinning of acetabular wall (protrusio) which may be the source of her leg pain. Fixing this would require a major operative effort which she would not be able to withstand. are written for physical therapy. will see the patient and examine her later this morning.  Electronic Signatures: Clare GandySmith, Zyanna Leisinger E (MD)  (Signed on 18-Oct-13 07:00)  Authored  Last Updated: 18-Oct-13 07:00 by Clare GandySmith, Tomeeka Plaugher E (MD)

## 2015-03-06 NOTE — H&P (Signed)
PATIENT NAME:  Alexis Costa, Charman C MR#:  [id number] DATE OF BIRTH:  1919-07-28  DATE OF ADMISSION:  07/13/2012  PRIMARY CARE PHYSICIAN:  Dr. Doylene Canninghoksi REQUESTING PHYSICIAN:  Dr. Dorothea GlassmanPaul Malinda     CHIEF COMPLAINT: Decreased oral intake and difficult ambulation.   HISTORY OF PRESENT ILLNESS: The patient is a 81 year old female with a known history of atrial fibrillation, breast cancer, and renal cancer who is being admitted for rapid atrial fibrillation and difficulty in ambulation. The patient lives at home and has been followed by a Home Health agency. For the last several days she has had a poor appetite with decreased p.o. intake. She has also been having a very difficult time ambulating. She already had two falls in the last one week. She was having a lot of leg pain for which her medications were changed from Vicodin to MS Contin along with oxycodone by her Home Health nurse with the help of her primary care physician.  Since then she has been feeling weaker. She has not had a bowel movement for last three days. She was also found to be more confused and the family was concerned that she might be overmedicated for pain. She had a large laceration on her right lower extremity and a lot of other bruises all over her body from recurrent falls. She was unable to ambulate here and her heart rate was found to be anywhere from 120s to 130s with atrial fibrillation and she is being admitted for further evaluation and management.   PAST MEDICAL HISTORY:  1. Breast cancer.  2. Transitional cell cancer.  3. History of bleeding. 4. Anxiety and depression.  5. Hypertension.  6. Gastroesophageal reflux disease. 7. Coronary artery disease.  SOCIAL HISTORY: No smoking or alcohol.   ALLERGIES: No known drug allergies.    PAST SURGICAL HISTORY:  1. Mastectomy times two in 1998 and 2002.  2. Hip replacement in 2004.   FAMILY HISTORY: Positive for prostate and head and neck cancer.   REVIEW OF SYSTEMS:  CONSTITUTIONAL: Positive for low-grade fever, fatigue, and weakness. EYES: No blurred or double vision. ENT: No tinnitus or ear pain. RESPIRATORY: No cough, wheezing, or hemoptysis. CARDIOVASCULAR: No chest pain, orthopnea, or edema. GASTROINTESTINAL: No nausea, vomiting, or diarrhea. Poor appetite. GU: No dysuria or hematuria. ENDOCRINE: No polyuria or nocturia. HEMATOLOGY: No anemia or easy bruising. Positive for bleeding. SKIN: Multiple bruises all over her body and rash on her right lower extremity along with right upper extremity status post falls times two in the last one week. MUSCULOSKELETAL:  Arthritis and muscle pain. NEUROLOGICAL: No tingling or numbness. Positive for weakness and difficult ambulation. PSYCH: History of anxiety.   MEDICATIONS AT HOME:  1. Alprazolam 0.25 mg p.o. every eight hours as needed.  2. Aspirin 81 mg p.o. daily.  3. Azopt 1% eyedrops, two drops in each eye. 4. Citalopram 10 mg p.o. daily.  5. Keflex 500 mg p.o. b.i.d. which was started last week.  6. Lasix 20 mg p.o. daily. 7. Metoprolol 50 mg p.o. b.i.d.  8. Mirtazapine 30 mg p.o. daily.  9. MS Contin 15 mg 1 tablet p.o. b.i.d.  10. Oxycodone 5 mg p.o. every six hours as needed.  11. Prednisone 10 mg p.o. daily.  12. Travatan 0.004% ophthalmic drop each eye once a day.   PHYSICAL EXAMINATION:  VITAL SIGNS: Temperature 98.7, heart rate 121 per minute, respirations 18 per minute, blood pressure 106/68 mmHg. She is saturating 96% on room air.   GENERAL: The  patient is a 81 year old female lying in bed comfortably without any acute distress.   EYES: Pupils equal, round, reactive to light and accommodation. No scleral icterus. Extraocular muscles intact.   HENT: Head atraumatic, normocephalic. Oropharynx and nasopharynx clear.   NECK: Supple. No jugular venous distention. No thyroid enlargement or tenderness.   LUNGS: Clear to auscultation bilaterally. No wheezes, rales, rhonchi, or crepitation.     CARDIOVASCULAR: Irregularly irregular heart sounds. No murmurs, rubs, or gallop.   ABDOMEN: Soft, nontender, nondistended. Bowel sounds present. No organomegaly or mass.   EXTREMITIES: No pedal edema, cyanosis, or clubbing.   SKIN: She has ecchymoses all over her body. She has about a 1 to 2-cm laceration on her right lower extremity with some whitish discharge behind her calf. She has a dressing present. She also has a laceration on her right arm which does not seem infected, but otherwise multiple ecchymoses and bruises all over her extremities.   NEUROLOGIC: Globally weak with at least 3 to 4/5 strength. Sensation intact. Cranial nerves III-XII intact. Gait not checked.   PSYCH: The patient is oriented to time, place, and person times three.   LABORATORY, DIAGNOSTIC, AND RADIOLOGICAL DATA: BMP within normal limits except creatinine 1.96, BUN 36. Normal liver function tests except total bilirubin 1.7, AST of 47. Normal first set of cardiac enzymes. Normal TSH. Normal CBC. Negative urinalysis. Normal coagulation panel.   Chest x-ray while in the Emergency Department showed no acute cardiopulmonary disease. EKG shows atrial fibrillation, rate of 151 per minute, with some left axis deviation. She does have some T-wave depression in lateral leads, mainly in V5 and V6.   IMPRESSION AND PLAN:  1. Failure to thrive with poor p.o. intake and recurrent falls, difficulty with ambulation. This could be due to pain medication. We will stop her MS Contin and oxycodone. Consult palliative care and get physical therapy evaluation. She may need placement to rehab.  2. Atrial fibrillation with rapid ventricular rate. Could be due to uncontrolled pain. We will start her on Percocet, stop her  oxycodone and MS Contin. Consult cardiology. She is likely not a candidate for anticoagulation due to recurrent falls and bleeding history. We will continue her aspirin and beta blockers and check serial cardiac enzymes.  Monitor on telemetry.  3. Acute on chronic kidney disease, stage II to III. Her baseline creatinine is 1.6 to 1.7, minimally worse.  We will start her on gentle hydration. Monitor her kidney function.  4. Over pain medication. We will switch her MS Contin and oxycodone to Percocet and monitor her pain control closely.  5. Possible lower extremity cellulitis, on Keflex. We will continue, consult wound care nurse for further evaluation for dressing changes. Her Keflex can be stopped in the next 2 to 3 days as she will finish close to 10-day course.  6. Constipation. We will put her on a stool softener. 7. CODE STATUS: FULL CODE.  TIME SPENT:  Total time taking care of this patient was 55 minutes including coordination of care, discussion with the ED physician, and talking with the family, explaining management plan, and also trying to reach her oncologist.    ____________________________ Ellamae Sia. Sherryll Burger, MD vss:bjt D: 07/13/2012 18:20:05 ET T: 07/14/2012 06:01:21 ET JOB#: [id number]  cc: Porter Moes S. Sherryll Burger, MD, <Dictator> Gerome Sam. Doylene Canning, MD Marcina Millard, MD Patricia Pesa MD ELECTRONICALLY SIGNED 07/15/2012 16:33

## 2015-03-06 NOTE — Consult Note (Signed)
PATIENT NAME:  Alexis Costa, LITTLE MR#:  [id number] DATE OF BIRTH:  [birth year]  DATE OF CONSULTATION:  07/14/2012  REFERRING PHYSICIAN:   CONSULTING PHYSICIAN:  Marcina Millard, MD  PRIMARY CARE PHYSICIAN:  Dr. Doylene Canning    CHIEF COMPLAINT:  Difficulty walking.  REASON FOR CONSULTATION: Consultation requested for evaluation of atrial fibrillation.   HISTORY OF PRESENT ILLNESS: The patient is a 81 year old female with nonischemic dilated cardiomyopathy and atrial fibrillation who was admitted with decreased p.o. intake and difficulty in ambulation. According to family members, the patient was recently switched from Vicodin and MS Contin and subsequently the patient has had difficulty with her ambulation with decreased mentation. She was brought to [facility] Emergency Room where EKG revealed atrial fibrillation with a ventricular rate of 110 to 130 bpm. As an outpatient the patient has atrial fibrillation with rapid rate in the same range. The patient is asymptomatic with regard to her atrial fibrillation.   PAST MEDICAL HISTORY:  1. Atrial fibrillation.  2. Transitional cell carcinoma.  3. Hypertension.  4. Gastroesophageal reflux disease.   MEDICATIONS ON ADMISSION:  1. Aspirin 81 mg daily.  2. Lasix 20 mg daily.  3. Metoprolol 50 mg b.i.d.  4. Alprazolam 0.5 mg q. 8 p.r.n.  5. Azopt eyedrops, two drops each eye.  6. Citalopram 10 mg daily.  7. Keflex 500 mg b.i.d.  8. Mirtazapine 30 mg daily.  9. MS Contin 15 mg b.i.d.  10. Oxycodone 5 mg q. 6 p.r.n.  11. Prednisone 10 mg daily.  12. Travatan eyedrops.    SOCIAL HISTORY: The patient is married. Resides with her husband. She has a remote tobacco abuse history.   FAMILY HISTORY: No immediate family history of coronary disease or myocardial infarction.   REVIEW OF SYSTEMS: CONSTITUTIONAL: No fever or chills. EYES: No blurry vision.  EARS: No hearing loss. RESPIRATORY: No shortness of breath. CARDIOVASCULAR:  The patient denies chest pain or palpitations. GASTROINTESTINAL: No nausea, vomiting, diarrhea, or constipation. GU: No dysuria or hematuria. ENDOCRINE: No polyuria or polydipsia. INTEGUMENT: No rash. MUSCULOSKELETAL: No arthralgias or myalgias. NEUROLOGIC: The patient denies any focal muscle weakness or numbness. PSYCHOLOGICAL: The patient denies depression or anxiety.   PHYSICAL EXAMINATION:  VITAL SIGNS: Blood pressure is 123/77, pulse 120 and irregularly irregular, respirations 20, temperature 98.7, pulse oximetry 98%.   HEENT: Pupils equal, reactive to light and accommodation.   NECK: Supple without thyromegaly.   LUNGS: Clear.   HEART: Normal jugular venous pressure. Normal point of maximal impulse. Irregularly irregular rhythm. Normal S1, S2. No appreciable gallop, murmur, or rub.   ABDOMEN: Soft and nontender. Pulses were intact bilaterally.   MUSCULOSKELETAL: Normal muscle tone.   NEUROLOGIC: The patient is alert and oriented times three. Motor and sensory both grossly intact.    IMPRESSION: 82 year old female who presents with difficulty with ambulation after switching from Vicodin to MS Contin, which has now improved. The patient has atrial fibrillation with a rapid ventricular response. However, her heart rate appears to be under reasonable control. There is no increase in heart rate compared to outpatient. The patient is not exhibiting any signs of congestive heart failure.   RECOMMENDATIONS:  1. Continue present medications.  2. Would not alter her atrial fibrillation regimen at this time.  3. Continue to follow up as an outpatient. The patient has follow-up appointment scheduled for next week.    ____________________________ Marcina Millard, MD ap:bjt D: 07/14/2012 09:12:11 ET T: 07/14/2012 11:23:24 ET JOB#: [id number]  cc: Marcina Millard,  MD, <Dictator> Marcina MillardALEXANDER Faithanne Verret MD ELECTRONICALLY SIGNED 07/21/2012 9:00

## 2015-03-06 NOTE — H&P (Signed)
PATIENT NAME:  Alexis Costa, Alexis Costa MR#:  [id number] DATE OF BIRTH:  May 03, 1919  DATE OF ADMISSION:  09/01/2012  PRIMARY CARE PHYSICIAN: Dr. Doylene Canning  HISTORY OF PRESENT ILLNESS: Patient is a 81 year old Caucasian female with past medical history significant for history of recent admission to the hospital in 06/2012 for atrial fibrillation, rapid ventricular response. She was sent from the hospital to [facility] facility and stayed there for three weeks, came back from [place] on 08/07/2012 and since that time since she came back home she is complaining of significant left leg pain, not able to ambulate well and being very weak. Today patient's husband led her to the bathroom and patient stated that she was not able to move. She caved in and sat down on floor. There was no syncope, however, patient laid her head down and husband could not get her up. As soon as he tried to touch her or move her up she would scream because everything was hurting her but mostly it was left leg/left hip area which was hurting her and seems to be hurting over past year. Patient's son as well as husband tell me that for some period of time patient progressively confused.  PAST MEDICAL HISTORY: 1. History of breast carcinoma diagnosed November 2002, 4.5 cm tumor status post Arimidex April 2003 to April 2008, breast carcinoma in February 1999. 2. Iron deficiency anemia. 3. Osteoporosis. 4. 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 Repeated surgery in April 2009. Hydronephrosis in July.  5. Congestive heart failure.  6. Anemia. 7. Back pains.  8. Coronary artery disease.  9. Gastroesophageal reflux disease.  10. Hypertension. 11. Depression, anxiety. 12. Chronic renal failure.  13. Atrial fibrillation.  14. Failure to thrive, adult.  15. Left hip replacement.  16. Mastectomy left in 2002, right in 1998.  17. History of bleeding in the past.  18. History  of gastroesophageal reflux disease.   SOCIAL HISTORY: No smoking or alcohol abuse. Lives with her usband.   ALLERGIES: No known drug allergies.   FAMILY HISTORY: No immediate family history of coronary artery disease or myocardial infarction according to medical records.   REVIEW OF SYSTEMS: Difficult to obtain, however, patient is telling me she hurts in left lower extremity. She does have some constipation. Denies any fevers, chills, fatigue, weakness. EYES: In regards to eyes, denies any blurry vision, double vision, glaucoma. ENT: Denies any tinnitus, allergies, epistaxis, sinus pain, dentures, difficulty swallowing. RESPIRATORY: Denies any cough, wheezes, asthma, chronic obstructive pulmonary disease. CARDIOVASCULAR: No chest pain, orthopnea, arrhythmia, palpitations or syncope. GASTROINTESTINAL: Denies nausea, vomiting, diarrhea, rectal bleeding, change in bowel habits. GENITOURINARY: Denies dysuria, hematuria, frequency, incontinence. ENDOCRINOLOGY: Denies any polydipsia, nocturia, thyroid problems, heat or cold intolerance, or thirst. HEME: Denies any anemia, easy bruising, bleeding, swollen glands. SKIN: Denies any acne, rash, lesions, change in moles. MUSCULOSKELETAL: Denies arthritis, cramps, swelling. NEUROLOGICAL: No numbness, epilepsy, tremor. PSYCH: Denies anxiety, insomnia.   PHYSICAL EXAMINATION: VITAL SIGNS: On arrival to the hospital patient's vital signs: Temperature 98.3, pulse 113 as high as 130s to 140s, respiratory rate 16, blood pressure 145/85, saturation 98% on room air.   GENERAL: This is a well nourished Caucasian female, pale and chronically sick sitting on the stretcher with her eyes closed.   HEENT: Her pupils are equal, reactive to light. Extraocular movements intact. No icterus or conjunctivitis. Has normal hearing. No pharyngeal erythema. Mucosa is moist.   NECK: No masses, supple, nontender. Thyroid is  not enlarged. No adenopathy. No JVD or carotid bruits  bilaterally. Full range of motion.   LUNGS: Clear to auscultation anteriorly. No rales, rhonchi, or wheezing. No labored inspirations, increased effort, dullness to percussion, overt respiratory distress.   CARDIOVASCULAR: S1, S2 appreciated. Irregularly irregular. PMI not lateralized. Chest is nontender to palpation. 1+ pedal pulses. No lower extremity edema, calf tenderness, or cyanosis.   ABDOMEN: Soft, nontender. Bowel sounds are present. No hepatosplenomegaly or masses were noted.   RECTAL: Deferred.   MUSCULOSKELETAL: Able to move all extremities, however, not able to assess her for hypokyphosis. No cyanosis. Gait is not tested.   SKIN: Skin did reveal numerous skin tears and dressing in upper extremities bilaterally. No other lesions, erythema, nodularity or induration. Skin was warm and dry to palpation.   LYMPH: No adenopathy in cervical region.   NEUROLOGICAL: Cranial nerves grossly intact. Sensory difficult to obtain as patient is somewhat somnolent and not cooperative. No dysarthria noted. Patient is somnolent, intermittently cooperative. Memory is impaired but no confusion, agitation noted.   LABORATORY, DIAGNOSTIC AND RADIOLOGICAL DATA: Patient's EKG showed atrial fibrillation at rate of 134 beats per minute, normal axis, low voltage QRS. Cannot rule out anteroseptal infarct according to EKG criteria. ST-T wave abnormalities, consider inferior ischemia versus digitalis effect according to EKG criteria. In comparison to prior EKG done in 06/2012 I didn't see too much difference in patient's EKGs.  Cardiomegaly, otherwise no congestive heart failure noted.   BMP showed glucose 101, BUN and creatinine were 35 and 1.99, otherwise unremarkable BMP. Patient's bicarbonate level, however, was low at 18. Estimated GFR for non-African American would be 22 in comparison to 19 to 23 in August/September 2013. Patient's liver enzymes: Albumin 3.2, total bilirubin 1.1, otherwise liver enzymes  are unremarkable. Troponin is elevated to 0.06, CK-MB as well as CK total fractions were within normal limits. White blood cell count is normal at 11.0, platelet count 136 and hemoglobin level is stable to 11.4. Coagulation panel: PT 16.2, INR 1.3 and activated PTT 28.9. Urinalysis: Yellow hazy urine, negative for glucose, bilirubin, or ketones, specific gravity 1.018, pH 5.0, negative for blood, 30 mg/dL protein, negative for nitrites or leukocyte esterase, 2 red blood cell, less than 1 white blood cell, no bacteria were seen, 1 epithelial cell was noted as well as mucous was present, 4 hyaline casts were noted. CT of head without contrast showed changes of atrophy as well as microangiopathy with minimal prominent perivascular spaces in the basal ganglia along with some basal ganglia calcification. No acute intracranial abnormality evident according to radiologist.   ASSESSMENT AND PLAN:  1. Generalized weakness, questionable due to atrial fibrillation/RVR. Admit patient to medical floor to telemetry. Get physical therapist involved as soon as we possibly can.  2. Atrial fibrillation/ RVR. Continue patient on metoprolol. Add also Cardizem p.o. as needed if her heart rate is above 100. Get echocardiogram as well as cardiology consultation will be obtained.  3. Elevated troponin, probably demand ischemia. Continue beta blocker as well as aspirin. Add nitroglycerin if patient's blood pressure tolerates and also heparin sub-Q. Check cardiac enzymes x3 and get cardiologist involved. Get echocardiogram.  4. Anemia, guaiac. 5. Chronic renal failure. Seemed to be stable since prior studies. Will continue outpatient medications at this point.  6. Acidosis. Get ABGs to rule out metabolic acidosis. Initiate bicarbonate if needed.  7. Elevated total bilirubin, unclear etiology. Get direct bilirubin, possibly fasting state related.  8. Left hip pain. Get orthopedic surgeon involved.  TIME SPENT: 50 minutes.     ____________________________ Katharina Caper, MD rv:cms D: 09/01/2012 21:08:48 ET T: 09/02/2012 08:23:23 ET JOB#: [id number]  cc: Katharina Caper, MD, <Dictator> Janak K. Doylene Canning, MD Katharina Caper MD ELECTRONICALLY SIGNED 10/01/2012 12:30

## 2015-03-06 NOTE — Consult Note (Signed)
Brief Consult Note: Diagnosis: AF, rate elevated but stable on metoprolol, CHF, stable.   Patient was seen by consultant.   Consult note dictated.   Comments: REC  Agree with current therapy, f/y as out-pateint, scheduled next week.  Electronic Signatures: Marcina MillardParaschos, Marijke Guadiana (MD)  (Signed 28-Aug-13 09:13)  Authored: Brief Consult Note   Last Updated: 28-Aug-13 09:13 by Marcina MillardParaschos, Nakaila Freeze (MD)

## 2015-03-06 NOTE — Consult Note (Signed)
I spoke with Mrs. Plemmons and her family today, indicating that I believe that most of her leg pain is coming from what is an equivalent of arthritis in her hip. I have stressed continuing strengthening exercise for her lower extremities and continue to attempt to get up in the chair and to ambulate full weight bearing with the walker.  Electronic Signatures: Clare GandySmith, Damara Klunder E (MD)  (Signed on 18-Oct-13 11:44)  Authored  Last Updated: 18-Oct-13 11:44 by Clare GandySmith, Lanie Schelling E (MD)

## 2015-03-06 NOTE — Consult Note (Signed)
General Aspect 81 year old female with admission for weakness, failure to thrive.  A. fib with RVR, chronic kidney disease stage III anemia, history of congestive heart failure with positive troponins.  Patient was admitted in August with dehydration, failure to thrive and was sent to rehabilitation.  She had been home from rehabilitation.  less than a month when she was readmitted due to weakness/ collapse/ unable to walk.  Husband states the patient's po intake had been poor but had picked up some in the last week.  Renal function does show evidence for dehydration, improved since admission.  A. fib showed upper rates of 140 on admission.  Now running in the 90s to 120s.  Is on metoprolol 100 twice a day and Cardizem 60 4 times a day has also been added.  Patient has not complained of any chest pain.  No shortness of breath.  Positive troponins  felt secondary to demand ischemia, not acute coronary syndrome.  Echo shows severe LV dysfunction of valve disease   Physical Exam:   GEN no acute distress    HEENT pink conjunctivae, hearing intact to voice    RESP normal resp effort  clear BS    CARD Irregular rate and rhythm  Normal, S1, S2    ABD denies tenderness  normal BS    EXTR negative edema, chronic bruisign upper and  lower extremities    SKIN skin turgor decreased    NEURO follows commands    PSYCH poor insight, lethargic   Review of Systems:   Subjective/Chief Complaint weakness with collapse, A. fib, RVR    General: Weakness    Skin: Color changes  chronic bruising    ENT: No Complaints    Eyes: No Complaints    Neck: No Complaints    Respiratory: No Complaints    Cardiovascular: A. fib, RVR    Gastrointestinal: poor oral intake    Genitourinary: No Complaints    Vascular: No Complaints    Musculoskeletal: extremity pain    Neurologic: confusion    Hematologic: Ease of bruising    Medications/Allergies Reviewed Medications/Allergies reviewed   Home  Medications: Medication Instructions Status  oxyCODONE 5 mg oral tablet 1 tab(s) orally every 4 hours, As Needed- for Pain  Active  Travatan 0.004% ophthalmic solution 1 gtt drops to each affected eye once a day  Active  alprazolam 0.25 mg oral tablet 1 tab(s) orally every 8 hours, As Needed- for Anxiety Active  citalopram 10 mg oral tablet 1 tab(s) orally once a day Active  aspirin 81 mg oral tablet 1 tab(s) orally once a day Active  predniSONE 10 mg oral tablet 1 tab(s) orally once a day Active  Azopt 1% ophthalmic suspension 2 drop(s) to each affected eye 2 times a day Active  metoprolol tartrate 100 mg oral tablet 1 tab(s) orally 2 times a day Active  Lasix 20 mg oral tablet 1 tab(s) orally once a day, As Needed for fluid Active  Remeron 1 tab(s) orally once a day (at bedtime) Active   Lab Results: Hepatic:  17-Oct-13 02:40    Bilirubin, Total 1.0   Alkaline Phosphatase 65   SGPT (ALT) 19   SGOT (AST)  41   Total Protein, Serum  5.6   Albumin, Serum  2.9  Routine Chem:  17-Oct-13 02:40    Result Comment CK - Slight hemolysis, interpret results with  - caution.  - TPL  Result(s) reported on 02 Sep 2012 at 03:34AM.   Glucose, Serum  86   BUN  36   Creatinine (comp)  1.69   Sodium, Serum 145   Potassium, Serum 5.0   Chloride, Serum  117   CO2, Serum  14   Calcium (Total), Serum 8.5   Osmolality (calc) 296   eGFR (African American)  30   eGFR (Non-African American)  26 (eGFR values <33mL/min/1.73 m2 may be an indication of chronic kidney disease (CKD). Calculated eGFR is useful in patients with stable renal function. The eGFR calculation will not be reliable in acutely ill patients when serum creatinine is changing rapidly. It is not useful in  patients on dialysis. The eGFR calculation may not be applicable to patients at the low and high extremes of body sizes, pregnant women, and vegetarians.)   Anion Gap 14  Cardiac:  17-Oct-13 02:40    Troponin I 0.05  (0.00-0.05 0.05 ng/mL or less: NEGATIVE  Repeat testing in 3-6 hrs  if clinically indicated. >0.05 ng/mL: POTENTIAL  MYOCARDIAL INJURY. Repeat  testing in 3-6 hrs if  clinically indicated. NOTE: An increase or decrease  of 30% or more on serial  testing suggests a  clinically important change)   CK, Total 55   CPK-MB, Serum 1.5    No Known Allergies:   Vital Signs/Nurse's Notes: **Vital Signs.:   17-Oct-13 12:13   Vital Signs Type Routine   Temperature Temperature (F) 97.6   Celsius 36.4   Temperature Source Oral   Pulse Pulse 94   Systolic BP Systolic BP 964   Diastolic BP (mmHg) Diastolic BP (mmHg) 66   Mean BP 82   Pulse Ox % Pulse Ox % 97   Pulse Ox Activity Level  At rest   Oxygen Delivery Room Air/ 21 %     Impression 81 year old female with history of LV dysfunction, valve disease, congestive heart failure, A. fib with RVR and general deconditioning/ failure to thrive, poor by mouth intake/ dehydration, chronic kidney disease with acute on chronic illness, likely driving  ventricular rate. Heart rate up to 110 resting is acceptable in this situation.  Heart rates are trending down with the addition of short acting Cardizem in combination with twice a day metoprolol at 100 mg. . Have to be careful not to over treat ventricular rate control, due to contributing to overall weakness.   Patient is not an anticoagulation candidate. Elevated troponins  thought secondary to demand ischemia, not acute coronary syndrome.    Plan above patient was seen in collaboration with Dr. Nehemiah Massed.   Electronic Signatures: Roderic Palau (NP)  (Signed 17-Oct-13 14:14)  Authored: General Aspect/Present Illness, History and Physical Exam, Review of System, Home Medications, Labs, Allergies, Vital Signs/Nurse's Notes, Impression/Plan   Last Updated: 17-Oct-13 14:14 by Roderic Palau (NP)

## 2015-03-06 NOTE — Consult Note (Signed)
History of Present Illness:   Reason for Consult History of breast cancer and transitional cell bladder cancer.    HPI   Patient has not been seen in the [place] since May 2013.  She is a 81 year old female who was recently admitted to hospital for rapidly fibrillation and difficulty walking.  Over the past several days she has had a poor appetite with minimal PO intake.  She also has had several falls this week.  Family expressed some concerns of confusion.  Currently, patient feels better since admission and offers no further specific complaints.  PFSH:   Additional Past Medical and Surgical History Past medical history: Breast cancer, transitional cell cancer, anxiety, depression, hypertension, GERD, CAD.  Past surgical history: Mastectomy x2, hip replacement.  Family history: Prostate, head and neck cancer.  Social history: No report of tobacco or alcohol use.   Review of Systems:   Performance Status (ECOG) 3    Review of Systems   As per HPI. Otherwise, 10 point system review was negative.   NURSING NOTES: **Vital Signs.:   28-Aug-13 12:18    Vital Signs Type: Routine    Temperature Temperature (F): 98.4    Celsius: 36.8    Temperature Source: oral    Pulse Pulse: 111    Respirations Respirations: 20    Systolic BP Systolic BP: 532    Diastolic BP (mmHg) Diastolic BP (mmHg): 75    Mean BP: 86    Pulse Ox % Pulse Ox %: 93    Pulse Ox Activity Level: At rest    Oxygen Delivery: Room Air/ 21 %   Physical Exam:   Physical Exam General: Thin, no acute distress. Eyes: Pink conjunctiva, anicteric sclera. Lungs: Clear to auscultation bilaterally. Heart: Irregularly irregular. Abdomen: Soft, normoactive bowel sounds. Musculoskeletal: No edema, cyanosis, or clubbing. Neuro: Alert, answering all questions appropriately. Cranial nerves grossly intact. Skin: No rashes or petechiae noted. Psych: Normal affect.    No Known Allergies:     Keflex 500 mg  oral capsule: 1 cap(s) orally 2 times a day, Active, 0, None   oxyCODONE 5 mg oral tablet: 1 tab(s) orally every 6 hours, As Needed- for Pain , Active, 60, None   MS Contin 15 mg/12 hr oral tablet, extended release: 1 tab(s) orally every 12 hours, Active, 30, None   Travatan 0.004% ophthalmic solution: 1 gtt drops to each affected eye once a day , Active, 0, None   Azopt 1% ophthalmic suspension: 2 gtt drops to each affected eye once a day , Active, 0, None   Lasix 20 mg oral tablet: 1 tab(s) orally once a day , Active, 0, None   alprazolam 0.25 mg oral tablet: 1 tab(s) orally every 8 hours, As Needed- for Anxiety, Active, 0, None   mirtazapine 30 mg oral tablet: 1 tab(s) orally once a day, Active, 30, 3   metoprolol tartrate 50 mg oral tablet: 1 tab(s) orally 2 times a day, Active, 0, None   citalopram 10 mg oral tablet: 1 tab(s) orally once a day, Active, 0, None   aspirin 81 mg oral tablet: 1 tab(s) orally once a day, Active, 0, None   predniSONE 10 mg oral tablet: 1 tab(s) orally once a day, Active, 0, None  Laboratory Results: Routine Chem:  28-Aug-13 03:44    Glucose, Serum 84   BUN  35   Creatinine (comp)  1.95   Sodium, Serum 143   Potassium, Serum  3.2   Chloride, Serum 106  CO2, Serum 26   Calcium (Total), Serum 8.5   Anion Gap 11   Osmolality (calc) 292   eGFR (African American)  26   eGFR (Non-African American)  22 (eGFR values <50m/min/1.73 m2 may be an indication of chronic kidney disease (CKD). Calculated eGFR is useful in patients with stable renal function. The eGFR calculation will not be reliable in acutely ill patients when serum creatinine is changing rapidly. It is not useful in  patients on dialysis. The eGFR calculation may not be applicable to patients at the low and high extremes of body sizes, pregnant women, and vegetarians.)   Magnesium, Serum  1.5 (1.8-2.4 THERAPEUTIC RANGE: 4-7 mg/dL TOXIC: > 10 mg/dL  -----------------------)   Cardiac:  28-Aug-13 03:44    Troponin I 0.04 (0.00-0.05 0.05 ng/mL or less: NEGATIVE  Repeat testing in 3-6 hrs  if clinically indicated. >0.05 ng/mL: POTENTIAL  MYOCARDIAL INJURY. Repeat  testing in 3-6 hrs if  clinically indicated. NOTE: An increase or decrease  of 30% or more on serial  testing suggests a  clinically important change)  Routine Hem:  28-Aug-13 03:44    WBC (CBC) 10.4   RBC (CBC)  3.59   Hemoglobin (CBC)  11.9   Hematocrit (CBC) 35.8   Platelet Count (CBC)  139   MCV 100   MCH 33.0   MCHC 33.1   RDW  18.6   Neutrophil % 73.5   Lymphocyte % 12.4   Monocyte % 12.5   Eosinophil % 1.0   Basophil % 0.6   Neutrophil #  7.6   Lymphocyte # 1.3   Monocyte #  1.3   Eosinophil # 0.1   Basophil # 0.1 (Result(s) reported on 14 Jul 2012 at 04:20AM.)   Assessment and Plan:  Impression:   History of breast and transitional cell cancer, now with a defibrillation failure to thrive.  Plan:   1.  Breast and transitional cell carcinoma: Givenpatient's multiple comorbidities of advanced age no more treatments are planned.  Hospice has been discussed in the past, patient has refused.  Patient can followup with Dr. COliva Bustardupon discharge for further evaluation. Rapid A. fib: Treatment per cardiology. Failure to thrive/declining performance status: Agree with palliative care consult. consult, call with questions.  Electronic Signatures: FDelight Hoh(MD)  (Signed 28-Aug-13 14:29)  Authored: HISTORY OF PRESENT ILLNESS, PFSH, ROS, NURSING NOTES, PE, ALLERGIES, HOME MEDICATIONS, LABS, ASSESSMENT AND PLAN   Last Updated: 28-Aug-13 14:29 by FDelight Hoh(MD)

## 2015-03-06 NOTE — Discharge Summary (Signed)
PATIENT NAME:  Alexis Costa, Alexis Costa MR#:  [id number] DATE OF BIRTH:  08-03-1919  DATE OF ADMISSION:  07/14/2012 DATE OF DISCHARGE:  07/17/2012  ADMITTING DIAGNOSIS: Decrease in p.o. intake, generalized weakness, and difficulty with ambulation.   DISCHARGE DIAGNOSES:  1. Acute encephalopathy possibly related to sedating pain medications. The patient is still a little sleepy. Adjustments to pain regimen has been made.  2. Acute renal failure, on chronic kidney disease. The patient's creatinine has remained stable.  3. Failure to thrive, status post palliative care evaluation, FULL CODE.  4. Atrial fibrillation with rapid ventricular response, heart rate is still slightly elevated. Metoprolol has been increased to twice a day.  5. Cellulitis of the leg status post treatment with antibiotics.  6. Hypokalemia, repleted.  7. History of breast cancer.  8. History of transitional cell cancer.  9. History of anxiety and depression.  10. Hypertension. 11. Coronary artery disease.  12. Gastroesophageal reflux disease.  13. Status post mastectomy x2.  14. Status post hip replacement. 15. History of some sort of bleeding, unclear location.   PERTINENT LABORATORY AND EVALUATIONS: BUN 36 and creatinine 1.96, otherwise BMP was normal. LFTs were normal, except total bilirubin was slightly elevated at 1.7 and AST of 47. TSH was 2.35. WBC 11, hemoglobin 12.3, and platelet count 151. INR was 1.2.   Troponin was less than 0.02 x3.  Urinalysis: Nitrites negative, leukocytes negative.   EKG showed atrial fibrillation with RVR.   Chest x-ray showed chronic obstructive pulmonary disease with cardiomegaly and underlying pulmonary fibrosis change appears present.   CONSULTANTS: Marcina Millard, MD  HOSPITAL COURSE: Please refer to history and physical done by the admitting physician. The patient is a 81 year old female with known history of atrial fibrillation, breast cancer, and renal cancer who was brought  to the ED with decrease in oral intake and difficulty with ambulation. The patient in the ED was noted to have atrial fibrillation with RVR. She was also confused and lethargic. Therefore, we were asked to admit the patient. The patient was admitted on telemetry and attempt to control her heart rate was done. The patient also was noted to have mild acute renal failure, therefore, she was given IV fluids. Due to her history of falls, the patient was seen by physical therapy and was referred to rehab. In terms of her lethargy and sleepiness, it was felt to be due to medications. Her medications have been changed. She continues to have a little intermittent sleepy state but is improved. At this time she is being discharged to rehab for further evaluation and treatment.   DISCHARGE MEDICATIONS:  1. Travatan 0.004% 1 GGT drop to each affected eye daily.  2. Azopt 1% ophthalmic solution 2 GTT drops to each affected eye once daily. 3. Lasix 20 daily.  4. Alprazolam 0.25 mg one tab p.o. every eight hours p.r.n. anxiety. 5. Mirtazapine 30 mg daily.  6. Citalopram 10 milligrams daily.  7. Aspirin 81 mg one tab p.o. daily.  8. Prednisone 10 mg p.o. daily.  9. Oxycodone 5 mg every eight hours p.r.n. pain. 10. Metoprolol tartrate 75 mg one tab p.o. twice a day.   DIET: Low sodium.   ACTIVITY: As tolerated with assistance.        DISCHARGE FOLLOWUP: Followup with Dr. Johney Maine in 1 to 2 weeks. Followup with Dr. Darrold Junker in 2 to 4 weeks. Physical therapy evaluation and management while at the facility.  TIME SPENT: 45 minutes spent on discharge. ____________________________ Lacie Scotts. Allena Katz,  MD shp:slb D: 07/17/2012 11:22:44 ET T: 07/17/2012 11:52:13 ET JOB#: [id number]  cc: Jadee Golebiewski H. Allena KatzPatel, MD, <Dictator> Charise CarwinSHREYANG H Sharda Keddy MD ELECTRONICALLY SIGNED 07/18/2012 8:15
